# Patient Record
Sex: Female | Born: 1975 | Race: Black or African American | Hispanic: Refuse to answer | Marital: Married | Smoking: Never smoker
Health system: Southern US, Community
[De-identification: ages and names within clinical notes are randomized; demographics above are authoritative.]

## PROBLEM LIST (undated history)

## (undated) ENCOUNTER — Ambulatory Visit (HOSPITAL_COMMUNITY): Disposition: A | Discharge status: Home / Self Care | Payer: 59

## (undated) DIAGNOSIS — T7840XA Allergy, unspecified, initial encounter: Secondary | ICD-10-CM

## (undated) DIAGNOSIS — Z789 Other specified health status: Secondary | ICD-10-CM

## (undated) HISTORY — PX: NO PAST SURGERIES: SHX2092

## (undated) HISTORY — DX: Allergy, unspecified, initial encounter: T78.40XA

---

## 2013-04-18 ENCOUNTER — Ambulatory Visit: Payer: 59 | Admitting: Family Medicine

## 2013-04-18 ENCOUNTER — Ambulatory Visit: Payer: 59

## 2013-04-18 VITALS — BP 100/62 | HR 71 | Temp 98.4°F | Resp 16 | Ht 66.5 in | Wt 180.0 lb

## 2013-04-18 DIAGNOSIS — R109 Unspecified abdominal pain: Secondary | ICD-10-CM

## 2013-04-18 DIAGNOSIS — K5900 Constipation, unspecified: Secondary | ICD-10-CM

## 2013-04-18 DIAGNOSIS — L299 Pruritus, unspecified: Secondary | ICD-10-CM

## 2013-04-18 LAB — POCT CBC
Granulocyte percent: 44.3 %G (ref 37–80)
HCT, POC: 40.2 % (ref 37.7–47.9)
Hemoglobin: 12.2 g/dL (ref 12.2–16.2)
Lymph, poc: 2.5 (ref 0.6–3.4)
MCH, POC: 27.3 pg (ref 27–31.2)
MCHC: 30.3 g/dL — AB (ref 31.8–35.4)
MCV: 89.9 fL (ref 80–97)
MID (cbc): 0.3 (ref 0–0.9)
MPV: 8.1 fL (ref 0–99.8)
POC Granulocyte: 2.3 (ref 2–6.9)
POC LYMPH PERCENT: 49.1 %L (ref 10–50)
POC MID %: 6.6 %M (ref 0–12)
Platelet Count, POC: 297 10*3/uL (ref 142–424)
RBC: 4.47 M/uL (ref 4.04–5.48)
RDW, POC: 14.4 %
WBC: 5.1 10*3/uL (ref 4.6–10.2)

## 2013-04-18 MED ORDER — NEOMYCIN-POLYMYXIN-HC 3.5-10000-1 OT SOLN: OTIC | Status: DC

## 2013-04-18 NOTE — Progress Notes (Signed)
Subjective:  37 year old lady who's been having problems with her left ear itching. She had this last year when she was in [place] and was given some antibiotics and some drops and things cleared up. This bothers her intermittently.   For the last 3 months she's been having abdominal bloating and discomfort. She feels like she has passed gas every 10 minutes. She says she's changed her diet. She is eating a lot more greens. She has tried Gas-X without help. She says her bowels move once every day, twice if she drinks milk.  Objective: Pleasant lady in no major distress. TMs normal. Ear canals look okay. Neck supple without nodes. Throat clear. Chest clear. Heart regular without murmurs. Abdomen has normal bowel sounds. Soft without masses though she feels like she has a moderate amount of stool in colon.  [facility] reading (PRIMARY) by  Dr. Alwyn Ren Normal abdomen. Moderate amount of stool in colon.  Results for orders placed in visit on 04/18/13  POCT CBC      Result Value Range   WBC 5.1  4.6 - 10.2 K/uL   Lymph, poc 2.5  0.6 - 3.4   POC LYMPH PERCENT 49.1  10 - 50 %L   MID (cbc) 0.3  0 - 0.9   POC MID % 6.6  0 - 12 %M   POC Granulocyte 2.3  2 - 6.9   Granulocyte percent 44.3  37 - 80 %G   RBC 4.47  4.04 - 5.48 M/uL   Hemoglobin 12.2  12.2 - 16.2 g/dL   HCT, POC 16.1  09.6 - 47.9 %   MCV 89.9  80 - 97 fL   MCH, POC 27.3  27 - 31.2 pg   MCHC 30.3 (*) 31.8 - 35.4 g/dL   RDW, POC 04.5     Platelet Count, POC 297  142 - 424 K/uL   MPV 8.1  0 - 99.8 fL   Assessment: Itching years, possible mild otitis externa History of allergic rhinitis Abdominal pain and bloating, probably secondary to constipation  Plan: MiraLax Cortisporin Otic drops  Return if not doing better  .

## 2013-04-18 NOTE — Patient Instructions (Addendum)
Drink lots of water  Get regular exercise  Take MiraLax one dose daily until stools are loose, then drop back to one half dose daily, then just use it as needed.  Use 3 or 4 drops of Cortisporin in the ear twice daily as needed for itching and irritation   Constipation, Adult Constipation is when a person has fewer than 3 bowel movements a week; has difficulty having a bowel movement; or has stools that are dry, hard, or larger than normal. As people grow older, constipation is more common. If you try to fix constipation with medicines that make you have a bowel movement (laxatives), the problem may get worse. Long-term laxative use may cause the muscles of the colon to become weak. A low-fiber diet, not taking in enough fluids, and taking certain medicines may make constipation worse. CAUSES   Certain medicines, such as antidepressants, pain medicine, iron supplements, antacids, and water pills.   Certain diseases, such as diabetes, irritable bowel syndrome (IBS), thyroid disease, or depression.   Not drinking enough water.   Not eating enough fiber-rich foods.   Stress or travel.  Lack of physical activity or exercise.  Not going to the restroom when there is the urge to have a bowel movement.  Ignoring the urge to have a bowel movement.  Using laxatives too much. SYMPTOMS   Having fewer than 3 bowel movements a week.   Straining to have a bowel movement.   Having hard, dry, or larger than normal stools.   Feeling full or bloated.   Pain in the lower abdomen.  Not feeling relief after having a bowel movement. DIAGNOSIS  Your caregiver will take a medical history and perform a physical exam. Further testing may be done for severe constipation. Some tests may include:   A barium enema X-ray to examine your rectum, colon, and sometimes, your small intestine.  A sigmoidoscopy to examine your lower colon.  A colonoscopy to examine your entire colon. TREATMENT   Treatment will depend on the severity of your constipation and what is causing it. Some dietary treatments include drinking more fluids and eating more fiber-rich foods. Lifestyle treatments may include regular exercise. If these diet and lifestyle recommendations do not help, your caregiver may recommend taking over-the-counter laxative medicines to help you have bowel movements. Prescription medicines may be prescribed if over-the-counter medicines do not work.  HOME CARE INSTRUCTIONS   Increase dietary fiber in your diet, such as fruits, vegetables, whole grains, and beans. Limit high-fat and processed sugars in your diet, such as [place] fries, hamburgers, cookies, candies, and soda.   A fiber supplement may be added to your diet if you cannot get enough fiber from foods.   Drink enough fluids to keep your urine clear or pale yellow.   Exercise regularly or as directed by your caregiver.   Go to the restroom when you have the urge to go. Do not hold it.  Only take medicines as directed by your caregiver. Do not take other medicines for constipation without talking to your caregiver first. SEEK IMMEDIATE MEDICAL CARE IF:   You have bright red blood in your stool.   Your constipation lasts for more than 4 days or gets worse.   You have abdominal or rectal pain.   You have thin, pencil-like stools.  You have unexplained weight loss. MAKE SURE YOU:   Understand these instructions.  Will watch your condition.  Will get help right away if you are not doing well or  get worse. Document Released: 08/15/2004 Document Revised: 02/09/2012 Document Reviewed: 10/21/2011 [facility] Patient Information 2013 [place], [place].

## 2013-04-19 LAB — COMPREHENSIVE METABOLIC PANEL
ALT: 12 U/L (ref 0–35)
Alkaline Phosphatase: 37 U/L — ABNORMAL LOW (ref 39–117)
Sodium: 140 mEq/L (ref 135–145)
Total Bilirubin: 0.1 mg/dL — ABNORMAL LOW (ref 0.3–1.2)
Total Protein: 7 g/dL (ref 6.0–8.3)

## 2013-07-09 ENCOUNTER — Ambulatory Visit: Payer: 59 | Admitting: Family Medicine

## 2013-07-09 VITALS — BP 88/52 | HR 90 | Temp 97.9°F | Resp 18 | Ht 67.0 in | Wt 178.8 lb

## 2013-07-09 DIAGNOSIS — N912 Amenorrhea, unspecified: Secondary | ICD-10-CM

## 2013-07-09 LAB — HCG, QUANTITATIVE, PREGNANCY: hCG, Beta Chain, Quant, S: 40574.9 m[IU]/mL

## 2013-07-09 NOTE — Progress Notes (Signed)
Is a 37 year old woman from [place] who has had a period since June 26. She's insistent on having a blood test for pregnancy because her urine pregnancy test was positive and she's not sure she can trust it  Assessment: Most likely patient is pregnant  Amenorrhea Serum HCG  Signed, Elvina Sidle, MD

## 2013-07-26 LAB — OB RESULTS CONSOLE GC/CHLAMYDIA
Chlamydia: NEGATIVE
GC PROBE AMP, GENITAL: NEGATIVE

## 2013-08-11 LAB — OB RESULTS CONSOLE RUBELLA ANTIBODY, IGM: RUBELLA: IMMUNE

## 2013-08-11 LAB — OB RESULTS CONSOLE ABO/RH: RH Type: POSITIVE

## 2013-08-11 LAB — OB RESULTS CONSOLE RPR: RPR: NONREACTIVE

## 2013-08-11 LAB — OB RESULTS CONSOLE ANTIBODY SCREEN: ANTIBODY SCREEN: NEGATIVE

## 2013-08-11 LAB — OB RESULTS CONSOLE HIV ANTIBODY (ROUTINE TESTING): HIV: NONREACTIVE

## 2013-08-11 LAB — OB RESULTS CONSOLE HEPATITIS B SURFACE ANTIGEN: Hepatitis B Surface Ag: NEGATIVE

## 2013-12-01 NOTE — L&D Delivery Note (Signed)
Patient was C/C/+3 and pushed for 30 minutes with epidural.    Severe variables seen earlier and with some pushes- VE offered, consented and accepted. VEVD  Female infant, Apgars 8,9, weight 8#15.   VE applied 4 times with 2 popoffs and pt delivered head on own.  Shoulder dystocia relieved with mcroberts and delivery of posterior arm within 45 sec. The patient had one mid line second degree episiotomy and one laceration of R distal vagina, both repaired with 2-0 vicryl R.. Fundus was firm. EBL was expected. Placenta was delivered intact. Vagina was clear.  Baby was vigorous and doing skin to skin with mother.  Armiyah Capron A

## 2014-02-03 LAB — OB RESULTS CONSOLE GBS
STREP GROUP B AG: NEGATIVE
STREP GROUP B AG: NEGATIVE

## 2014-02-22 ENCOUNTER — Inpatient Hospital Stay (HOSPITAL_COMMUNITY)
Admission: AD | Admit: 2014-02-22 | Discharge: 2014-02-22 | Disposition: A | Discharge status: Home / Self Care | Payer: 59 | Source: Ambulatory Visit | Attending: Obstetrics and Gynecology | Admitting: Obstetrics and Gynecology

## 2014-02-22 ENCOUNTER — Encounter (HOSPITAL_COMMUNITY): Payer: Self-pay | Admitting: *Deleted

## 2014-02-22 ENCOUNTER — Encounter (HOSPITAL_COMMUNITY): Payer: Self-pay | Admitting: Obstetrics

## 2014-02-22 ENCOUNTER — Inpatient Hospital Stay (HOSPITAL_COMMUNITY)
Admission: AD | Admit: 2014-02-22 | Discharge: 2014-02-25 | DRG: 775 | Disposition: A | Discharge status: Home / Self Care | Payer: 59 | Source: Ambulatory Visit | Attending: Obstetrics and Gynecology | Admitting: Obstetrics and Gynecology

## 2014-02-22 DIAGNOSIS — O09529 Supervision of elderly multigravida, unspecified trimester: Secondary | ICD-10-CM | POA: Diagnosis present

## 2014-02-22 DIAGNOSIS — O479 False labor, unspecified: Secondary | ICD-10-CM | POA: Insufficient documentation

## 2014-02-22 HISTORY — DX: Other specified health status: Z78.9

## 2014-02-22 LAB — CBC
HCT: 35.4 % — ABNORMAL LOW (ref 36.0–46.0)
Hemoglobin: 11.8 g/dL — ABNORMAL LOW (ref 12.0–15.0)
MCH: 27.4 pg (ref 26.0–34.0)
MCHC: 33.3 g/dL (ref 30.0–36.0)
MCV: 82.1 fL (ref 78.0–100.0)
Platelets: 213 10*3/uL (ref 150–400)
RBC: 4.31 MIL/uL (ref 3.87–5.11)
RDW: 15.5 % (ref 11.5–15.5)
WBC: 9.6 10*3/uL (ref 4.0–10.5)

## 2014-02-22 MED ORDER — OXYTOCIN BOLUS FROM INFUSION: 500.0000 mL | INTRAVENOUS | Status: DC

## 2014-02-22 MED ORDER — OXYTOCIN 40 UNITS IN LACTATED RINGERS INFUSION - SIMPLE MED
62.5000 mL/h | INTRAVENOUS | Status: DC
  Administered 2014-02-23: 62.5 mL/h via INTRAVENOUS

## 2014-02-22 MED ORDER — OXYCODONE-ACETAMINOPHEN 5-325 MG PO TABS: 1.0000 | ORAL_TABLET | ORAL | Status: DC | PRN

## 2014-02-22 MED ORDER — ACETAMINOPHEN 325 MG PO TABS
650.0000 mg | ORAL_TABLET | ORAL | Status: DC | PRN
  Administered 2014-02-23: 650 mg via ORAL
  Filled 2014-02-22: qty 2

## 2014-02-22 MED ORDER — CITRIC ACID-SODIUM CITRATE 334-500 MG/5ML PO SOLN: 30.0000 mL | ORAL | Status: DC | PRN

## 2014-02-22 MED ORDER — FLEET ENEMA 7-19 GM/118ML RE ENEM: 1.0000 | ENEMA | RECTAL | Status: DC | PRN

## 2014-02-22 MED ORDER — LACTATED RINGERS IV SOLN
INTRAVENOUS | Status: DC
  Administered 2014-02-22 – 2014-02-23 (×2): via INTRAVENOUS
  Administered 2014-02-23 (×2): 125 mL/h via INTRAVENOUS

## 2014-02-22 MED ORDER — LIDOCAINE HCL (PF) 1 % IJ SOLN
30.0000 mL | INTRAMUSCULAR | Status: DC | PRN
  Administered 2014-02-23: 30 mL via SUBCUTANEOUS
  Filled 2014-02-22: qty 30

## 2014-02-22 MED ORDER — IBUPROFEN 600 MG PO TABS: 600.0000 mg | ORAL_TABLET | Freq: Four times a day (QID) | ORAL | Status: DC | PRN

## 2014-02-22 MED ORDER — ONDANSETRON HCL 4 MG/2ML IJ SOLN: 4.0000 mg | Freq: Four times a day (QID) | INTRAMUSCULAR | Status: DC | PRN

## 2014-02-22 MED ORDER — BUTORPHANOL TARTRATE 1 MG/ML IJ SOLN
1.0000 mg | Freq: Once | INTRAMUSCULAR | Status: AC | PRN
  Administered 2014-02-22: 1 mg via INTRAVENOUS
  Filled 2014-02-22: qty 1

## 2014-02-22 MED ORDER — LACTATED RINGERS IV SOLN
500.0000 mL | INTRAVENOUS | Status: DC | PRN
  Administered 2014-02-22: 500 mL via INTRAVENOUS

## 2014-02-22 NOTE — MAU Note (Signed)
Pt insisted on RN to call Dr. Claiborne Billingsallahan reference scheduled appt today at 1400.  Pt advised to go to regular appt. Today. Pt did not want to go home and come back.  Dr. Claiborne Billingsallahan called, advised office was closed for lunch and pt will need to come to appt at scheduled time.  Advised pt/family.  Pt given info on early labor and advised to return to MAU if ROM or pain increases with U/C's every 2-3 minutes for 1-2 hours.

## 2014-02-22 NOTE — MAU Note (Signed)
uc's since 0600 this a.m., pt denies LOF or bleeding.

## 2014-02-22 NOTE — MAU Note (Signed)
Contraction started at 6 am got really strong at 12 noon and has not stop. No bleeding or leaking of fliud

## 2014-02-22 NOTE — Discharge Instructions (Signed)
Early Elective Birth Early elective birth refers to making a choice to have a baby before the time the baby is due. The length of a pregnancy is 9 months, or 40 weeks, starting from the beginning of a woman's last menstrual period. Most women naturally go into labor around 40 weeks of gestation. A full-term pregnancy is considered between 37 weeks and 42 weeks of gestation. Currently, early elective births can take place sometime after 39 weeks of gestation. Most health care providers practice within the guidelines of delivering a baby no later than 42 weeks of gestation and no earlier than 39 weeks of gestation. There are exceptions to this time interval, and the risks involved to the mother and baby need to be considered in those cases.  Induction of labor refers to the use of medicines to bring aboutcontractions. Labor is when the cervix starts to widen (dilate). Active labor is when there are contractions and the cervix has dilated to at least 4 cm. Often times, the earlier a mother is in her pregnancy, the longer it takes to get induced. When the cervix is ready (dilated and soft), an induction may take less than a day. However, when a cervix is far away from being ready (long, closed, and firm), it may take days in a hospital for labor to start.  Currently, 39 weeks of gestation is considered the earliest a health care providershould start the induction process. This is because the longer the baby stays inside the uterus, the lower the risks are to both the baby and mother. However, sometimes there are very good reasons for a pregnancy to be induced before 39 weeks of gestation. These exceptions are specific to each individual pregnancy and need to be considered on a case-by-case basis. A good reason to induce one pregnancy may not be good a good reason for another pregnancy.  REASONS FOR ELECTIVE BIRTH It may be safer to induce labor before 39 weeks of gestation if:   A woman is carrying more than 1  baby. Current standards are to deliver twin pregnancies at 38 weeks of gestation.  A woman is having complications, such as:  High blood pressure caused by pregnancy (preeclampsia).  Bleeding.  Infection.  There are conditions affecting the baby's health, such as:  Intrauterine growth restriction (IUGR), where the baby is not growing well.  Having abnormal fetal heart rate patterns on the monitor (nonreassuring tracing).  Having a lack of fluid that surrounds the baby (oligohydramnios).  Having placental issues.  Fluid that surrounds the baby (amniotic fluid) is leaking. There are many other safety reasons that a pregnancy may need to be induced early. REASONS AGAINST ELECTIVE BIRTH Sometimes early elective birth is not the best choice. It may not be a good idea if:   An early birth is just more convenient.  You want the baby to be born on a certain date, like a holiday.  You are more likely to need a cesarean delivery before 39 weeks of gestation. A cesarean delivery can lead to other problems. Problems include infection, bleeding, and not having enough iron in your blood (anemia), which can cause weakness.  Babies born early (34 37 weeks of gestation):  May need special care at the hospital or in a special care nursery.  Are at a greater risk for:  Brain damage.  Feeding problems.  Breathing problems.  Slow physical and mental development.  May need special care in a neonatal intensive care unit (NICU), but this is rare.   The length of the baby's stay in the hospital will depend on how quickly he or she progresses to a safe level of care.  Are at a greater risk for:  Infection.  Bleeding inside the brain.  Dying during their first year of life. REDUCING EARLY ELECTIVE BIRTHS Carrying a baby longer than 42 weeks of gestation is not good for the baby or the mother. A full-term pregnancy is best for baby and mother. Anything earlier can be risky for you and your  baby. Remember:  An early elective birth may lead to a cesarean delivery. This can lead to other problems for the mother and baby.  An early elective birth can result in developmental problems for your child.  A baby's brain continues to develop while in the uterus.  A baby's body continues to develop. The baby will be better able to breathe and eat when he or she is born near the due date.  A baby who stays in the uterus longer responds better. The baby will also bond better with you. Document Released: 07/30/2011 Document Revised: 09/07/2013 Document Reviewed: 06/16/2013 [employer] Patient Information 2014 [employer].  

## 2014-02-23 ENCOUNTER — Encounter (HOSPITAL_COMMUNITY): Payer: Self-pay | Admitting: *Deleted

## 2014-02-23 ENCOUNTER — Encounter (HOSPITAL_COMMUNITY): Payer: 59 | Admitting: Anesthesiology

## 2014-02-23 ENCOUNTER — Inpatient Hospital Stay (HOSPITAL_COMMUNITY): Payer: 59 | Admitting: Anesthesiology

## 2014-02-23 LAB — TYPE AND SCREEN
ABO/RH(D): O POS
ANTIBODY SCREEN: NEGATIVE

## 2014-02-23 LAB — ABO/RH: ABO/RH(D): O POS

## 2014-02-23 LAB — RPR: RPR: NONREACTIVE

## 2014-02-23 MED ORDER — SODIUM CHLORIDE 0.9 % IJ SOLN: 3.0000 mL | INTRAMUSCULAR | Status: DC | PRN

## 2014-02-23 MED ORDER — SODIUM CHLORIDE 0.9 % IV SOLN: 250.0000 mL | INTRAVENOUS | Status: DC | PRN

## 2014-02-23 MED ORDER — SENNOSIDES-DOCUSATE SODIUM 8.6-50 MG PO TABS
2.0000 | ORAL_TABLET | ORAL | Status: DC
  Administered 2014-02-23: 2 via ORAL
  Administered 2014-02-25: 1 via ORAL
  Filled 2014-02-23 (×2): qty 2

## 2014-02-23 MED ORDER — LANOLIN HYDROUS EX OINT: TOPICAL_OINTMENT | CUTANEOUS | Status: DC | PRN

## 2014-02-23 MED ORDER — LACTATED RINGERS IV SOLN: 500.0000 mL | Freq: Once | INTRAVENOUS | Status: DC

## 2014-02-23 MED ORDER — IBUPROFEN 800 MG PO TABS
800.0000 mg | ORAL_TABLET | Freq: Three times a day (TID) | ORAL | Status: DC
  Administered 2014-02-23 – 2014-02-25 (×4): 800 mg via ORAL
  Filled 2014-02-23 (×4): qty 1

## 2014-02-23 MED ORDER — FENTANYL 2.5 MCG/ML BUPIVACAINE 1/10 % EPIDURAL INFUSION (WH - ANES)
INTRAMUSCULAR | Status: DC | PRN
  Administered 2014-02-23: 14 mL/h via EPIDURAL

## 2014-02-23 MED ORDER — METHYLERGONOVINE MALEATE 0.2 MG/ML IJ SOLN: 0.2000 mg | INTRAMUSCULAR | Status: DC | PRN

## 2014-02-23 MED ORDER — TERBUTALINE SULFATE 1 MG/ML IJ SOLN: 0.2500 mg | Freq: Once | INTRAMUSCULAR | Status: DC | PRN

## 2014-02-23 MED ORDER — INFLUENZA VAC SPLIT QUAD 0.5 ML IM SUSP
0.5000 mL | INTRAMUSCULAR | Status: AC
  Administered 2014-02-24: 0.5 mL via INTRAMUSCULAR

## 2014-02-23 MED ORDER — SIMETHICONE 80 MG PO CHEW: 80.0000 mg | CHEWABLE_TABLET | ORAL | Status: DC | PRN

## 2014-02-23 MED ORDER — EPHEDRINE 5 MG/ML INJ
10.0000 mg | INTRAVENOUS | Status: DC | PRN
  Filled 2014-02-23: qty 2

## 2014-02-23 MED ORDER — FERROUS SULFATE 325 (65 FE) MG PO TABS
325.0000 mg | ORAL_TABLET | Freq: Two times a day (BID) | ORAL | Status: DC
  Administered 2014-02-24 – 2014-02-25 (×3): 325 mg via ORAL
  Filled 2014-02-23 (×3): qty 1

## 2014-02-23 MED ORDER — PRENATAL MULTIVITAMIN CH
1.0000 | ORAL_TABLET | Freq: Every day | ORAL | Status: DC
  Administered 2014-02-24: 1 via ORAL
  Filled 2014-02-23: qty 1

## 2014-02-23 MED ORDER — PHENYLEPHRINE 40 MCG/ML (10ML) SYRINGE FOR IV PUSH (FOR BLOOD PRESSURE SUPPORT)
80.0000 ug | PREFILLED_SYRINGE | INTRAVENOUS | Status: DC | PRN
  Filled 2014-02-23: qty 2

## 2014-02-23 MED ORDER — TETANUS-DIPHTH-ACELL PERTUSSIS 5-2.5-18.5 LF-MCG/0.5 IM SUSP
0.5000 mL | Freq: Once | INTRAMUSCULAR | Status: AC
Start: 2014-02-24 — End: 2014-02-24
  Administered 2014-02-24: 0.5 mL via INTRAMUSCULAR

## 2014-02-23 MED ORDER — ZOLPIDEM TARTRATE 5 MG PO TABS: 5.0000 mg | ORAL_TABLET | Freq: Every evening | ORAL | Status: DC | PRN

## 2014-02-23 MED ORDER — EPHEDRINE 5 MG/ML INJ: 10.0000 mg | INTRAVENOUS | Status: DC | PRN

## 2014-02-23 MED ORDER — ONDANSETRON HCL 4 MG/2ML IJ SOLN
4.0000 mg | INTRAMUSCULAR | Status: DC | PRN
Start: 2014-02-23 — End: 2014-02-25

## 2014-02-23 MED ORDER — LIDOCAINE HCL (PF) 1 % IJ SOLN
INTRAMUSCULAR | Status: DC | PRN
  Administered 2014-02-23: 5 mL
  Administered 2014-02-23: 3 mL
  Administered 2014-02-23: 5 mL

## 2014-02-23 MED ORDER — PHENYLEPHRINE 40 MCG/ML (10ML) SYRINGE FOR IV PUSH (FOR BLOOD PRESSURE SUPPORT): 80.0000 ug | PREFILLED_SYRINGE | INTRAVENOUS | Status: DC | PRN

## 2014-02-23 MED ORDER — DIPHENHYDRAMINE HCL 25 MG PO CAPS: 25.0000 mg | ORAL_CAPSULE | Freq: Four times a day (QID) | ORAL | Status: DC | PRN

## 2014-02-23 MED ORDER — DIBUCAINE 1 % RE OINT: 1.0000 "application " | TOPICAL_OINTMENT | RECTAL | Status: DC | PRN

## 2014-02-23 MED ORDER — WITCH HAZEL-GLYCERIN EX PADS: 1.0000 "application " | MEDICATED_PAD | CUTANEOUS | Status: DC | PRN

## 2014-02-23 MED ORDER — BENZOCAINE-MENTHOL 20-0.5 % EX AERO
1.0000 "application " | INHALATION_SPRAY | CUTANEOUS | Status: DC | PRN
  Administered 2014-02-23 – 2014-02-25 (×3): 1 via TOPICAL
  Filled 2014-02-23 (×3): qty 56

## 2014-02-23 MED ORDER — MEASLES, MUMPS & RUBELLA VAC ~~LOC~~ INJ
0.5000 mL | INJECTION | Freq: Once | SUBCUTANEOUS | Status: DC
Start: 2014-02-24 — End: 2014-02-24

## 2014-02-23 MED ORDER — FENTANYL 2.5 MCG/ML BUPIVACAINE 1/10 % EPIDURAL INFUSION (WH - ANES)
14.0000 mL/h | INTRAMUSCULAR | Status: DC | PRN
  Administered 2014-02-23: 14 mL/h via EPIDURAL
  Filled 2014-02-23 (×3): qty 125

## 2014-02-23 MED ORDER — SODIUM CHLORIDE 0.9 % IJ SOLN: 3.0000 mL | Freq: Two times a day (BID) | INTRAMUSCULAR | Status: DC

## 2014-02-23 MED ORDER — FENTANYL 2.5 MCG/ML BUPIVACAINE 1/10 % EPIDURAL INFUSION (WH - ANES): 14.0000 mL/h | INTRAMUSCULAR | Status: DC | PRN

## 2014-02-23 MED ORDER — OXYTOCIN 40 UNITS IN LACTATED RINGERS INFUSION - SIMPLE MED
1.0000 m[IU]/min | INTRAVENOUS | Status: DC
  Administered 2014-02-23: 2 m[IU]/min via INTRAVENOUS
  Filled 2014-02-23: qty 1000

## 2014-02-23 MED ORDER — ONDANSETRON HCL 4 MG PO TABS: 4.0000 mg | ORAL_TABLET | ORAL | Status: DC | PRN

## 2014-02-23 MED ORDER — FAMOTIDINE 20 MG PO TABS
20.0000 mg | ORAL_TABLET | Freq: Two times a day (BID) | ORAL | Status: DC
  Administered 2014-02-23 – 2014-02-25 (×3): 20 mg via ORAL
  Filled 2014-02-23 (×4): qty 1

## 2014-02-23 MED ORDER — OXYCODONE-ACETAMINOPHEN 5-325 MG PO TABS
1.0000 | ORAL_TABLET | ORAL | Status: DC | PRN
  Administered 2014-02-23: 1 via ORAL
  Filled 2014-02-23: qty 1

## 2014-02-23 MED ORDER — DIPHENHYDRAMINE HCL 50 MG/ML IJ SOLN: 12.5000 mg | INTRAMUSCULAR | Status: DC | PRN

## 2014-02-23 MED ORDER — LORATADINE 10 MG PO TABS
10.0000 mg | ORAL_TABLET | Freq: Every day | ORAL | Status: DC
  Administered 2014-02-24 – 2014-02-25 (×2): 10 mg via ORAL
  Filled 2014-02-23 (×3): qty 1

## 2014-02-23 MED ORDER — EPHEDRINE 5 MG/ML INJ
10.0000 mg | INTRAVENOUS | Status: DC | PRN
  Filled 2014-02-23: qty 4
  Filled 2014-02-23: qty 2

## 2014-02-23 MED ORDER — PHENYLEPHRINE 40 MCG/ML (10ML) SYRINGE FOR IV PUSH (FOR BLOOD PRESSURE SUPPORT)
80.0000 ug | PREFILLED_SYRINGE | INTRAVENOUS | Status: DC | PRN
  Filled 2014-02-23: qty 10
  Filled 2014-02-23: qty 2

## 2014-02-23 MED ORDER — METHYLERGONOVINE MALEATE 0.2 MG PO TABS: 0.2000 mg | ORAL_TABLET | ORAL | Status: DC | PRN

## 2014-02-23 MED ORDER — MAGNESIUM HYDROXIDE 400 MG/5ML PO SUSP: 30.0000 mL | ORAL | Status: DC | PRN

## 2014-02-23 NOTE — Anesthesia Preprocedure Evaluation (Signed)

## 2014-02-23 NOTE — Anesthesia Procedure Notes (Signed)
Epidural Patient location during procedure: OB  Staffing Anesthesiologist: Arlene Brickel Performed by: anesthesiologist   Preanesthetic Checklist Completed: patient identified, site marked, surgical consent, pre-op evaluation, timeout performed, IV checked, risks and benefits discussed and monitors and equipment checked  Epidural Patient position: sitting Prep: ChloraPrep Patient monitoring: heart rate, continuous pulse ox and blood pressure Approach: right paramedian Location: L2-L3 Injection technique: LOR saline  Needle:  Needle type: Tuohy  Needle gauge: 17 G Needle length: 9 cm and 9 Needle insertion depth: 6 cm Catheter type: closed end flexible Catheter size: 20 Guage Catheter at skin depth: 11 cm Test dose: negative  Assessment Events: blood not aspirated, injection not painful, no injection resistance, negative IV test and no paresthesia  Additional Notes   Patient tolerated the insertion well without complications.   

## 2014-02-23 NOTE — Progress Notes (Deleted)
While in other room for delivery, I watched this strip.  In the last 30 minutes she has had lates that have develolped into severe variables in late presentation.  Short term variability is good but accels are not reactive.  SVE 4-5/80/-2  Pt is still hours away from vaginal delivery and strip is not reassuring.  These episodes of moderate to severe variables have persisted on and off for several hours and in last 30 min have been persistent.  There is meconium stained fluid indicating that baby is stressed.   All R/B/Alt of LTCS d/w pt and she agrees to proceed with urgent case.

## 2014-02-23 NOTE — Progress Notes (Signed)
CTSP  Pt now C/C/  But now having repetitive moderate variables.  Still has great short term variability but now tachy.

## 2014-02-23 NOTE — Lactation Note (Signed)
This note was copied from the chart of Megan [name]. Lactation Consultation Note Mom called for assistance in pumping. RN had set up DEBP.mom stated baby will not eat. Baby born at [id number] today. Laying on mom's chest STS. Mom insist on pumping right away for baby to eat. Very worried about baby not eating, and baby was occasionally crying when touched. Explained this was all normal, baby's usually don't eat much the first 24 hrs. I did suck training w/gloved finger. Biting, wouldn't open mouth much, tongue wouldn't come past gum line. DEBP applied to Lt. Nipple, mom concerned nothing coming out. Demonstrated hand expression, explained could get more colostrum that way d/t thickness. Mom happy to see colostrum w/hand expression. Mom wanted to know who was going to change the babies diapers. I explained parents are responsible for changing the babies diaper d/t they will need to know how when they go home. Mom appeared stressed and tired. Stated she needed to go to the BR. Nurse called. I tried to explain the dynamics of DEBP but mom didn't appear to be an attentive mode. Will return later this evening to check on pt. And try to finish teaching. Spoke good English, FOB at bedside spoke good [place] as well. Patient Name: Megan Kalman JewelsYasmin Al [name] EAVWU'JToday's Date: 02/23/2014 Reason for consult: Initial assessment   Maternal Data Infant to breast within first hour of birth: Yes (wouldn't feed) Has patient been taught Hand Expression?: Yes (but will need reinstructed) Does the patient have breastfeeding experience prior to this delivery?: No  Feeding Feeding Type: Breast Fed Length of feed: 0 min (rooting but wouldn't suck)  LATCH Score/Interventions Latch: Too sleepy or reluctant, no latch achieved, no sucking elicited. Intervention(s): Skin to skin;Teach feeding cues  Audible Swallowing: None  Type of Nipple: Flat (short shaft/compresses inward ) Intervention(s): Shells;Double electric  pump  Comfort (Breast/Nipple): Soft / non-tender     Hold (Positioning): Full assist, staff holds infant at breast  LATCH Score: 3  Lactation Tools Discussed/Used Tools: Shells;Pump Shell Type: Inverted Breast pump type: Double-Electric Breast Pump Pump Review: Other (comment) (needs reviewing, mom to uptight to comprehend.) Initiated by:: Peri JeffersonL. Cache Bills RN Date initiated:: 02/23/14   Consult Status Date: 02/23/14 Follow-up type: In-patient    Charyl DancerCARVER, Reily Ilic G 02/23/2014, 8:52 PM

## 2014-02-23 NOTE — Lactation Note (Signed)
This note was copied from the chart of Megan Kalyse Al [name]. Lactation Consultation Note Mom upset, standing and pumping to stimulate breast. Grandmother crying, mom says she is crying because the baby is hungry when he cries and their culture they feed the baby "herbs" for 2 days until the milk comes in. Asking please give the baby something. Discussed the risk of formula and bottle feeding, agreed to give formula in cup or syring. States she will pump every 3 hrs. To stimulate breast and if she gets any colostrum she will give that to the baby. Baby has no interest in BF at this time. Fretting, rooting, but will not latch. Formula guidelines given according to age w/measuring cup and foley cup. Mom very appreciative and thanking me. Mom w/teach back instructions on feeding baby done. Mom DEBP for 10 min. And RN gave baby .5ml colostrum in syring given before cup feeding. Breast massage and hand expression demonstrated again. Encouraged to call for assistance if needed and to verify proper latch. Patient Name: Megan [name] ZOXWR'UToday's Date: 02/23/2014 Reason for consult: Initial assessment   Maternal Data Infant to breast within first hour of birth: Yes (wouldn't feed) Has patient been taught Hand Expression?: Yes (but will need reinstructed) Does the patient have breastfeeding experience prior to this delivery?: No  Feeding Feeding Type: Breast Milk Length of feed: 0 min (rooting but wouldn't suck)  LATCH Score/Interventions Latch: Too sleepy or reluctant, no latch achieved, no sucking elicited. Intervention(s): Skin to skin;Teach feeding cues;Waking techniques  Audible Swallowing: None Intervention(s): Hand expression (Mom states that it hurts her and doesnt want to do it)  Type of Nipple: Flat Intervention(s): Double electric pump  Comfort (Breast/Nipple): Soft / non-tender     Hold (Positioning): Full assist, staff holds infant at breast Intervention(s): Support  Pillows;Position options  LATCH Score: 3  Lactation Tools Discussed/Used Tools: Shells;Pump Shell Type: Inverted Breast pump type: Double-Electric Breast Pump Pump Review: Other (comment) (needs reviewing, mom to uptight to comprehend.) Initiated by:: Peri JeffersonL. Milledge Gerding RN Date initiated:: 02/23/14   Consult Status Date: 02/23/14 Follow-up type: In-patient    Charyl DancerCARVER, Constantinos Krempasky G 02/23/2014, 11:04 PM

## 2014-02-23 NOTE — H&P (Signed)
38 y.o. 6258w1d  G2P0010 comes in c/o labor.  Otherwise has good fetal movement and no bleeding.  Past Medical History  Diagnosis Date  . Allergy   . Medical history non-contributory     Past Surgical History  Procedure Laterality Date  . No past surgeries      OB History  Gravida Para Term Preterm AB SAB TAB Ectopic Multiple Living  2    1 1     0    # Outcome Date GA Lbr Len/2nd Weight Sex Delivery Anes PTL Lv  2 CUR           1 SAB               History   Social History  . Marital Status: Married    Spouse Name: N/[name]    Number of Children: N/[name]  . Years of Education: N/[name]   Occupational History  . Not on file.   Social History Main Topics  . Smoking status: Never Smoker   . Smokeless tobacco: Not on file  . Alcohol Use: No  . Drug Use: No  . Sexual Activity: Yes    Birth Control/ Protection: None   Other Topics Concern  . Not on file   Social History Narrative  . No narrative on file   Banana    Prenatal Transfer Tool  Maternal Diabetes: No Genetic Screening: Normal - amnio 46 XY Maternal Ultrasounds/Referrals: Normal Fetal Ultrasounds or other Referrals:  None Maternal Substance Abuse:  No Significant Maternal Medications:  None Significant Maternal Lab Results: None  Other PNC: uncomplicated.    Filed Vitals:   02/23/14 0722  BP: 96/60  Pulse: 91  Temp: 99.2 F (37.3 C)  Resp: 18     Lungs/Cor:  NAD Abdomen:  soft, gravid Ex:  no cords, erythema SVE:  2.5/C/-1 per nurse FHTs:  140, good STV, NST R Toco:  q3   [name]/P   Early labor.  GBS neg.  Megan [name]

## 2014-02-24 LAB — CBC
HEMATOCRIT: 29.7 % — AB (ref 36.0–46.0)
HEMOGLOBIN: 9.7 g/dL — AB (ref 12.0–15.0)
MCH: 27 pg (ref 26.0–34.0)
MCHC: 32.7 g/dL (ref 30.0–36.0)
MCV: 82.7 fL (ref 78.0–100.0)
Platelets: 171 10*3/uL (ref 150–400)
RBC: 3.59 MIL/uL — ABNORMAL LOW (ref 3.87–5.11)
RDW: 15.6 % — ABNORMAL HIGH (ref 11.5–15.5)
WBC: 14.7 10*3/uL — ABNORMAL HIGH (ref 4.0–10.5)

## 2014-02-24 NOTE — Progress Notes (Signed)
Post Partum Day 1 Subjective: up ad lib, voiding, tolerating PO and + flatus Patient tearful because she is having difficulty breast feeding.  Objective: Blood pressure 114/63, pulse 81, temperature 97.8 F (36.6 C), temperature source Oral, resp. rate 17, height 5\' 7"  (1.702 m), weight 104.327 kg (230 lb), last menstrual period 05/23/2013, SpO2 100.00%, unknown if currently breastfeeding.  Physical Exam:  General: alert, cooperative and no distress Lochia: appropriate Uterine Fundus: firm Incision (perineum): healing well DVT Evaluation: No evidence of DVT seen on physical exam. Negative Homan's sign. No significant calf/ankle edema.   Recent Labs  02/22/14 2304 02/24/14 0639  HGB 11.8* 9.7*  HCT 35.4* 29.7*    Assessment/Plan: Plan for discharge tomorrow, Lactation consult and Circumcision prior to discharge   LOS: 2 days   Megan [name] STACIA 02/24/2014, 8:49 AM

## 2014-02-24 NOTE — Lactation Note (Signed)
This note was copied from the chart of Megan [name]. Lactation Consultation Note Follow up visit at 27 hours of age.  Mom is very concerned about how she is going to feed her baby at home.  She is supplementing with formula and pumping every 3 hours and not seeing any colostrum.  Explained to mom this is normal and tried to calm her, mom is very anxious.  Mom reports trying to breast feed all day, but nothing is documented and I question moms breast attempts.  Mom says baby is hungry, but doesn't want to breast feed baby now.  Explained to mom supply and demand and need for baby to be at breast, but discussed other feeding options.  Mom attempts latch in football hold with poor positioning and does not follow instructions for improvement on latch.  Baby sucks at the tip of the nipple when mom hand expressed colostrum. Discussed how this was not a latch and baby doesn't work to get breast milk that way. I attempted to hand express prior to latch and mom was too uncomfortable with the pain.  Mom made loud moans sounding like pain many times during baby's attempt to latch but says its not pain just the feeling because this is new to her.  Encouraged mom to continue STS and latch attempt and to then supplement 7-12 mls per guidelines for age.  Mom seems very unsure about her commitment to breast feeding.  Encouraged mom to continue to pump every 3 hours and to hand express.  Mom is guarded with accepting help from me [facility].  Might even questions moms competence in learning to breast feed, but it may be cultural.   [facility] and discussed future feeding options if baby does not latch well.  Patient Name: Megan [name] ZOXWR'UToday's Date: 02/24/2014 Reason for consult: Initial assessment;Follow-up assessment;Difficult latch   Maternal Data Has patient been taught Hand Expression?: Yes  Feeding Feeding Type: Breast Fed  LATCH Score/Interventions Latch: Repeated attempts needed  to sustain latch, nipple held in mouth throughout feeding, stimulation needed to elicit sucking reflex. Intervention(s): Skin to skin;Teach feeding cues;Waking techniques Intervention(s): Adjust position;Assist with latch;Breast massage;Breast compression  Audible Swallowing: None Intervention(s): Hand expression;Skin to skin  Type of Nipple: Flat  Comfort (Breast/Nipple): Soft / non-tender     Hold (Positioning): Assistance needed to correctly position infant at breast and maintain latch. Intervention(s): Breastfeeding basics reviewed;Support Pillows;Position options;Skin to skin  LATCH Score: 5  Lactation Tools Discussed/Used Breast pump type: Double-Electric Breast Pump   Consult Status Consult Status: Follow-up Date: 02/25/14 Follow-up type: In-patient    Beverely RisenShoptaw, Arvella MerlesJana Lynn 02/24/2014, 8:18 PM

## 2014-02-24 NOTE — Anesthesia Postprocedure Evaluation (Signed)
  Anesthesia Post-op Note  Patient: Megan [name]  Procedure(s) Performed: * No procedures listed *  Patient Location: Mother/Baby  Anesthesia Type:Epidural  Level of Consciousness: awake and alert   Airway and Oxygen Therapy: Patient Spontanous Breathing  Post-op Pain: mild  Post-op Assessment: Patient's Cardiovascular Status Stable, Respiratory Function Stable, No signs of Nausea or vomiting, Pain level controlled, No headache, No residual numbness and No residual motor weakness  Post-op Vital Signs: stable  Complications: No apparent anesthesia complications

## 2014-02-25 MED ORDER — IBUPROFEN 800 MG PO TABS: 800.0000 mg | ORAL_TABLET | Freq: Three times a day (TID) | ORAL | Status: AC

## 2014-02-25 NOTE — Progress Notes (Signed)
Post Partum Day 2 Subjective: no complaints, up ad lib, voiding, tolerating PO and + flatus  Objective: Blood pressure 126/82, pulse 76, temperature 97.9 F (36.6 C), temperature source Oral, resp. rate 18, height 5\' 7"  (1.702 m), weight 104.327 kg (230 lb), last menstrual period 05/23/2013, SpO2 100.00%, unknown if currently breastfeeding.  Physical Exam:  General: alert, cooperative and no distress Lochia: appropriate Uterine Fundus: firm DVT Evaluation: No evidence of DVT seen on physical exam. No significant calf/ankle edema.   Recent Labs  02/22/14 2304 02/24/14 0639  HGB 11.8* 9.7*  HCT 35.4* 29.7*    Assessment/Plan: Discharge home and Contraception will discuss at post partum visit   LOS: 3 days   Megan [name] STACIA 02/25/2014, 8:19 AM

## 2014-02-25 NOTE — Discharge Instructions (Signed)
°Iron-Rich Diet ° °An iron-rich diet contains foods that are good sources of iron. Iron is an important mineral that helps your body produce hemoglobin. Hemoglobin is a protein in red blood cells that carries oxygen to the body's tissues. Sometimes, the iron level in your blood can be low. This may be caused by: °· A lack of iron in your diet. °· Blood loss. °· Times of growth, such as during pregnancy or during a child's growth and development. °Low levels of iron can cause a decrease in the number of red blood cells. This can result in iron deficiency anemia. Iron deficiency anemia symptoms include: °· Tiredness. °· Weakness. °· Irritability. °· Increased chance of infection. °Here are some recommendations for daily iron intake: °· Males older than 37 years of age need 8 mg of iron per day. °· Women ages 19 to 50 need 18 mg of iron per day. °· Pregnant women need 27 mg of iron per day, and women who are over 19 years of age and breastfeeding need 9 mg of iron per day. °· Women over the age of 50 need 8 mg of iron per day. °SOURCES OF IRON °There are 2 types of iron that are found in food: heme iron and nonheme iron. Heme iron is absorbed by the body better than nonheme iron. Heme iron is found in meat, poultry, and fish. Nonheme iron is found in grains, beans, and vegetables. °Heme Iron Sources °Food / Iron (mg) °· Chicken liver, 3 oz (85 g)/ 10 mg °· Beef liver, 3 oz (85 g)/ 5.5 mg °· Oysters, 3 oz (85 g)/ 8 mg °· Beef, 3 oz (85 g)/ 2 to 3 mg °· Shrimp, 3 oz (85 g)/ 2.8 mg °· Turkey, 3 oz (85 g)/ 2 mg °· Chicken, 3 oz (85 g) / 1 mg °· Fish (tuna, halibut), 3 oz (85 g)/ 1 mg °· Pork, 3 oz (85 g)/ 0.9 mg °Nonheme Iron Sources °Food / Iron (mg) °· Ready-to-eat breakfast cereal, iron-fortified / 3.9 to 7 mg °· Tofu, ½ cup / 3.4 mg °· Kidney beans, ½ cup / 2.6 mg °· Baked potato with skin / 2.7 mg °· Asparagus, ½ cup / 2.2 mg °· Avocado / 2 mg °· Dried peaches, ½ cup / 1.6 mg °· Raisins, ½ cup / 1.5 mg °· Soy milk, 1  cup / 1.5 mg °· Whole-wheat bread, 1 slice / 1.2 mg °· Spinach, 1 cup / 0.8 mg °· Broccoli, ½ cup / 0.6 mg °IRON ABSORPTION °Certain foods can decrease the body's absorption of iron. Try to avoid these foods and beverages while eating meals with iron-containing foods: °· Coffee. °· Tea. °· Fiber. °· Soy. °Foods containing vitamin C can help increase the amount of iron your body absorbs from iron sources, especially from nonheme sources. Eat foods with vitamin C along with iron-containing foods to increase your iron absorption. Foods that are high in vitamin C include many fruits and vegetables. Some good sources are: °· Fresh orange juice. °· Oranges. °· Strawberries. °· Mangoes. °· Grapefruit. °· Red bell peppers. °· Green bell peppers. °· Broccoli. °· Potatoes with skin. °· Tomato juice. °Document Released: 07/01/2005 Document Revised: 02/09/2012 Document Reviewed: 05/08/2011 °[employer]® Patient Information ©2014 [employer]. °Vaginal Delivery °Care After °Refer to this sheet in the next few weeks. These discharge instructions provide you with information on caring for yourself after delivery. Your caregiver may also give you specific instructions. Your treatment has been planned according to the most current   medical practices available, but problems sometimes occur. Call your caregiver if you have any problems or questions after you go home. °HOME CARE INSTRUCTIONS °· Take over-the-counter or prescription medicines only as directed by your caregiver or pharmacist. °· Do not drink alcohol, especially if you are breastfeeding or taking medicine to relieve pain. °· Do not chew or smoke tobacco. °· Do not use illegal drugs. °· Continue to use good perineal care. Good perineal care includes: °· Wiping your perineum from front to back. °· Keeping your perineum clean. °· Do not use tampons or douche until your caregiver says it is okay. °· Shower, wash your hair, and take tub baths as directed by your  caregiver. °· Wear a well-fitting bra that provides breast support. °· Eat healthy foods. °· Drink enough fluids to keep your urine clear or pale yellow. °· Eat high-fiber foods such as whole grain cereals and breads, brown rice, beans, and fresh fruits and vegetables every day. These foods may help prevent or relieve constipation. °· Follow your cargiver's recommendations regarding resumption of activities such as climbing stairs, driving, lifting, exercising, or traveling. °· Talk to your caregiver about resuming sexual activities. Resumption of sexual activities is dependent upon your risk of infection, your rate of healing, and your comfort and desire to resume sexual activity. °· Try to have someone help you with your household activities and your newborn for at least a few days after you leave the hospital. °· Rest as much as possible. Try to rest or take a nap when your newborn is sleeping. °· Increase your activities gradually. °· Keep all of your scheduled postpartum appointments. It is very important to keep your scheduled follow-up appointments. At these appointments, your caregiver will be checking to make sure that you are healing physically and emotionally. °SEEK MEDICAL CARE IF:  °· You are passing large clots from your vagina. Save any clots to show your caregiver. °· You have a foul smelling discharge from your vagina. °· You have trouble urinating. °· You are urinating frequently. °· You have pain when you urinate. °· You have a change in your bowel movements. °· You have increasing redness, pain, or swelling near your vaginal incision (episiotomy) or vaginal tear. °· You have pus draining from your episiotomy or vaginal tear. °· Your episiotomy or vaginal tear is separating. °· You have painful, hard, or reddened breasts. °· You have a severe headache. °· You have blurred vision or see spots. °· You feel sad or depressed. °· You have thoughts of hurting yourself or your newborn. °· You have  questions about your care, the care of your newborn, or medicines. °· You are dizzy or lightheaded. °· You have a rash. °· You have nausea or vomiting. °· You were breastfeeding and have not had a menstrual period within 12 weeks after you stopped breastfeeding. °· You are not breastfeeding and have not had a menstrual period by the 12th week after delivery. °· You have a fever. °SEEK IMMEDIATE MEDICAL CARE IF:  °· You have persistent pain. °· You have chest pain. °· You have shortness of breath. °· You faint. °· You have leg pain. °· You have stomach pain. °· Your vaginal bleeding saturates two or more sanitary pads in 1 hour. °MAKE SURE YOU:  °· Understand these instructions. °· Will watch your condition. °· Will get help right away if you are not doing well or get worse. °Document Released: 11/14/2000 Document Revised: 08/11/2012 Document Reviewed: 07/14/2012 °[employer]® Patient Information ©  2014 [employer]. ° °

## 2014-02-25 NOTE — Discharge Summary (Signed)
Obstetric Discharge Summary Reason for Admission: onset of labor Prenatal Procedures: none Intrapartum Procedures: spontaneous vaginal delivery Postpartum Procedures: none Complications-Operative and Postpartum: none Hemoglobin  Date Value Ref Range Status  02/24/2014 9.7* 12.0 - 15.0 g/dL Final     DELTA CHECK NOTED     REPEATED TO VERIFY  04/18/2013 12.2  12.2 - 16.2 g/dL Final     HCT  Date Value Ref Range Status  02/24/2014 29.7* 36.0 - 46.0 % Final     HCT, POC  Date Value Ref Range Status  04/18/2013 40.2  37.7 - 47.9 % Final    Physical Exam:  General: alert, cooperative and no distress Lochia: appropriate Uterine Fundus: firm DVT Evaluation: No evidence of DVT seen on physical exam. No significant calf/ankle edema.  Discharge Diagnoses: Term Pregnancy-delivered  Discharge Information: Date: 02/25/2014 Activity: pelvic rest Diet: routine Medications: PNV and Ibuprofen Condition: stable Instructions: refer to practice specific booklet Discharge to: home   Newborn Data: Live born female  Birth Weight: 8 lb 15 oz (4054 g) APGAR: 8, 9  Home with mother.  Megan HartINN, Megan [name] STACIA 02/25/2014, 8:26 AM

## 2014-07-19 ENCOUNTER — Ambulatory Visit (INDEPENDENT_AMBULATORY_CARE_PROVIDER_SITE_OTHER): Payer: 59 | Admitting: Podiatry

## 2014-07-19 ENCOUNTER — Encounter: Payer: Self-pay | Admitting: Podiatry

## 2014-07-19 VITALS — BP 102/68 | HR 68 | Ht 67.0 in | Wt 205.0 lb

## 2014-07-19 DIAGNOSIS — M79676 Pain in unspecified toe(s): Secondary | ICD-10-CM | POA: Insufficient documentation

## 2014-07-19 DIAGNOSIS — M79609 Pain in unspecified limb: Secondary | ICD-10-CM

## 2014-07-19 DIAGNOSIS — L600 Ingrowing nail: Secondary | ICD-10-CM | POA: Insufficient documentation

## 2014-07-19 MED ORDER — EFINACONAZOLE 10 % EX SOLN: 1.0000 | Freq: Every morning | CUTANEOUS | Status: AC

## 2014-07-19 NOTE — Progress Notes (Signed)
38 year old female presents with 966 mo old baby with ingrown nail x 1 month. Has had previously ingrown nail and had surgery done.  Stated that she has to go to work [employer] and [employer] execut[employer]orrow.  Objective: Dermatologic: Ingrown nail with erythema at medial border of both great toes. Mild fungal nail left great toe. Neurovascular status are within normal. Normal osseous structures.  Assessment: Ingrown nail with inflammation both great toes at medial border. No active drainage noted. Fungal nail left hallux.  Plan: Reviewed findings and available options. Patient will return on her day off to have nail surgery done.

## 2014-07-19 NOTE — Patient Instructions (Signed)
Seen for ingrown nail. Patient will return for ingrown nail surgery on her day off.

## 2014-08-04 ENCOUNTER — Encounter: Payer: Self-pay | Admitting: Podiatry

## 2014-08-04 ENCOUNTER — Ambulatory Visit (INDEPENDENT_AMBULATORY_CARE_PROVIDER_SITE_OTHER): Payer: 59 | Admitting: Podiatry

## 2014-08-04 VITALS — BP 109/74 | HR 77 | Ht 67.0 in | Wt 199.0 lb

## 2014-08-04 DIAGNOSIS — M79674 Pain in right toe(s): Secondary | ICD-10-CM

## 2014-08-04 DIAGNOSIS — M79675 Pain in left toe(s): Secondary | ICD-10-CM

## 2014-08-04 DIAGNOSIS — L600 Ingrowing nail: Secondary | ICD-10-CM

## 2014-08-04 DIAGNOSIS — M79609 Pain in unspecified limb: Secondary | ICD-10-CM

## 2014-08-04 DIAGNOSIS — M79676 Pain in unspecified toe(s): Secondary | ICD-10-CM

## 2014-08-04 NOTE — Progress Notes (Signed)
Patient presents to have toe nail surgery. Both big toes and in both borders.   Procedures done: 1. Phenol and alcohol matrixectomy left hallux both borders. 2. Phenol and alcohol matrixectomy right hallux both borders.  Procedure done as follow; Both great toes were anesthetized with total 5ml mixture of 50/50 0.5% Marcaine plain and 1% Xylocaine plain. Total 10ml used. Affected nail borders were reflected with a nail elevator and excised with nail nipper. Both borders of proximal nail matrix tissue was cauterized with Phenol soaked cotton applicator x 4 and neutralized with Alcohol soaked cotton applicator. The wound was dressed with Amerigel ointment dressing. Identical procedures were done on both great toes. Home care instructions and supply dispensed.  Return in 1 week for follow up.

## 2014-08-09 ENCOUNTER — Encounter: Payer: 59 | Admitting: Podiatry

## 2014-10-02 ENCOUNTER — Encounter: Payer: Self-pay | Admitting: Podiatry

## 2017-10-27 ENCOUNTER — Encounter (HOSPITAL_COMMUNITY): Payer: Self-pay

## 2017-10-27 ENCOUNTER — Emergency Department (HOSPITAL_COMMUNITY)
Admission: EM | Admit: 2017-10-27 | Discharge: 2017-10-27 | Disposition: A | Discharge status: Home / Self Care | Payer: 59 | Attending: Emergency Medicine | Admitting: Emergency Medicine

## 2017-10-27 ENCOUNTER — Other Ambulatory Visit: Payer: Self-pay

## 2017-10-27 ENCOUNTER — Emergency Department (HOSPITAL_COMMUNITY): Payer: 59

## 2017-10-27 DIAGNOSIS — R110 Nausea: Secondary | ICD-10-CM | POA: Diagnosis not present

## 2017-10-27 DIAGNOSIS — R1031 Right lower quadrant pain: Secondary | ICD-10-CM | POA: Diagnosis present

## 2017-10-27 DIAGNOSIS — N83201 Unspecified ovarian cyst, right side: Secondary | ICD-10-CM

## 2017-10-27 LAB — CBC WITH DIFFERENTIAL/PLATELET
BASOS ABS: 0 10*3/uL (ref 0.0–0.1)
BASOS PCT: 0 %
EOS ABS: 0 10*3/uL (ref 0.0–0.7)
Eosinophils Relative: 1 %
HCT: 39.1 % (ref 36.0–46.0)
Hemoglobin: 13 g/dL (ref 12.0–15.0)
Lymphocytes Relative: 23 %
Lymphs Abs: 1.4 10*3/uL (ref 0.7–4.0)
MCH: 28.5 pg (ref 26.0–34.0)
MCHC: 33.2 g/dL (ref 30.0–36.0)
MCV: 85.7 fL (ref 78.0–100.0)
Monocytes Absolute: 0.3 10*3/uL (ref 0.1–1.0)
Monocytes Relative: 5 %
NEUTROS PCT: 71 %
Neutro Abs: 4.3 10*3/uL (ref 1.7–7.7)
Platelets: 242 10*3/uL (ref 150–400)
RBC: 4.56 MIL/uL (ref 3.87–5.11)
RDW: 13.3 % (ref 11.5–15.5)
WBC: 5.9 10*3/uL (ref 4.0–10.5)

## 2017-10-27 LAB — COMPREHENSIVE METABOLIC PANEL
ALBUMIN: 4.4 g/dL (ref 3.5–5.0)
ALT: 18 U/L (ref 14–54)
ANION GAP: 9 (ref 5–15)
AST: 19 U/L (ref 15–41)
Alkaline Phosphatase: 41 U/L (ref 38–126)
BILIRUBIN TOTAL: 0.5 mg/dL (ref 0.3–1.2)
BUN: 16 mg/dL (ref 6–20)
CHLORIDE: 102 mmol/L (ref 101–111)
CO2: 25 mmol/L (ref 22–32)
Calcium: 9.2 mg/dL (ref 8.9–10.3)
Creatinine, Ser: 0.76 mg/dL (ref 0.44–1.00)
GFR calc Af Amer: >60 mL/min (ref >60)
GFR calc non Af Amer: >60 mL/min (ref >60)
GLUCOSE: 109 mg/dL — AB (ref 65–99)
POTASSIUM: 3.8 mmol/L (ref 3.5–5.1)
Sodium: 136 mmol/L (ref 135–145)
Total Protein: 8 g/dL (ref 6.5–8.1)

## 2017-10-27 LAB — URINALYSIS, ROUTINE W REFLEX MICROSCOPIC
Bilirubin Urine: NEGATIVE
Glucose, UA: NEGATIVE mg/dL
Hgb urine dipstick: NEGATIVE
Ketones, ur: NEGATIVE mg/dL
LEUKOCYTES UA: NEGATIVE
Nitrite: NEGATIVE
Protein, ur: NEGATIVE mg/dL
SPECIFIC GRAVITY, URINE: 1.006 (ref 1.005–1.030)
pH: 7 (ref 5.0–8.0)

## 2017-10-27 LAB — LIPASE, BLOOD: Lipase: 22 U/L (ref 11–51)

## 2017-10-27 LAB — I-STAT BETA HCG BLOOD, ED (MC, WL, AP ONLY)

## 2017-10-27 MED ORDER — MORPHINE SULFATE (PF) 4 MG/ML IV SOLN
4.0000 mg | Freq: Once | INTRAVENOUS | Status: DC
  Filled 2017-10-27: qty 1

## 2017-10-27 MED ORDER — IOPAMIDOL (ISOVUE-300) INJECTION 61%
INTRAVENOUS | Status: AC
  Filled 2017-10-27: qty 100

## 2017-10-27 MED ORDER — NAPROXEN 500 MG PO TABS: 500.0000 mg | ORAL_TABLET | Freq: Two times a day (BID) | ORAL | 0 refills | Status: AC

## 2017-10-27 MED ORDER — SODIUM CHLORIDE 0.9 % IV BOLUS (SEPSIS)
1000.0000 mL | Freq: Once | INTRAVENOUS | Status: AC
  Administered 2017-10-27: 1000 mL via INTRAVENOUS

## 2017-10-27 MED ORDER — ONDANSETRON 4 MG PO TBDP: 4.0000 mg | ORAL_TABLET | Freq: Three times a day (TID) | ORAL | 0 refills | Status: AC | PRN

## 2017-10-27 MED ORDER — OXYCODONE-ACETAMINOPHEN 5-325 MG PO TABS: 1.0000 | ORAL_TABLET | Freq: Four times a day (QID) | ORAL | 0 refills | Status: AC | PRN

## 2017-10-27 MED ORDER — KETOROLAC TROMETHAMINE 30 MG/ML IJ SOLN
30.0000 mg | Freq: Once | INTRAMUSCULAR | Status: AC
Start: 2017-10-27 — End: 2017-10-27
  Administered 2017-10-27: 30 mg via INTRAVENOUS
  Filled 2017-10-27: qty 1

## 2017-10-27 MED ORDER — ONDANSETRON HCL 4 MG/2ML IJ SOLN
4.0000 mg | Freq: Once | INTRAMUSCULAR | Status: AC
  Administered 2017-10-27: 4 mg via INTRAVENOUS
  Filled 2017-10-27: qty 2

## 2017-10-27 MED ORDER — MORPHINE SULFATE (PF) 4 MG/ML IV SOLN
4.0000 mg | Freq: Once | INTRAVENOUS | Status: AC
  Administered 2017-10-27: 4 mg via INTRAVENOUS
  Filled 2017-10-27: qty 1

## 2017-10-27 MED ORDER — IOPAMIDOL (ISOVUE-300) INJECTION 61%
100.0000 mL | Freq: Once | INTRAVENOUS | Status: AC | PRN
  Administered 2017-10-27: 100 mL via INTRAVENOUS

## 2017-10-27 NOTE — ED Provider Notes (Signed)
[facility]-EMERGENCY DEPT Provider Note   CSN: [id number] Arrival date & time: 10/27/17  1143     History   Chief Complaint Chief Complaint  Patient presents with  . Abdominal Pain    HPI Megan [name] is a 41 y.o. female who presents to the ED with complaints of sudden onset RLQ pain that began at 4pm yesterday afternoon.  Pt states she went to [facility] and they did a U/A which had a slight amount of blood in it (which she thinks is because she just ended her menses on Sunday 2 days ago) so they gave her flomax as well as phenergan and vicodin, but said if she didn't improve then to go to the ER to get a CT scan to r/o appendicitis.  She describes her pain as 10/10 constant aching and throbbing nonradiating RLQ pain that worsens with walking and has been unrelieved with Flomax, Phenergan, and Vicodin.  She reports associated nausea.  She had an apple around 11 AM in order to take her medications, but has not eaten anything else.  Her PCP is [place].  She denies fevers, chills, CP, SOB, vomiting, diarrhea/constipation, obstipation, melena, hematochezia, hematuria, dysuria, vaginal bleeding/discharge, myalgias, arthralgias, numbness, tingling, focal weakness, or any other complaints at this time. Denies recent travel, sick contacts, suspicious food intake, EtOH use, NSAID use, or prior abd surgeries.    The history is provided by the patient and medical records. No language interpreter was used.  Abdominal Pain   This is a new problem. The current episode started yesterday. The problem occurs constantly. The problem has been gradually worsening. The pain is associated with an unknown factor. The pain is located in the RLQ. The quality of the pain is aching and throbbing. The pain is at a severity of 10/10. The pain is severe. Associated symptoms include nausea. Pertinent negatives include fever, diarrhea, flatus, hematochezia, melena, vomiting, constipation, dysuria,  hematuria, arthralgias and myalgias. The symptoms are aggravated by activity. Nothing relieves the symptoms.    Past Medical History:  Diagnosis Date  . Allergy   . Medical history non-contributory     Patient Active Problem List   Diagnosis Date Noted  . Ingrown nail 07/19/2014  . Pain in toe 07/19/2014  . Postpartum state 02/23/2014  . Normal labor and delivery 02/22/2014    Past Surgical History:  Procedure Laterality Date  . NO PAST SURGERIES      OB History    Gravida Para Term Preterm AB Living   2 1 1   1 1    SAB TAB Ectopic Multiple Live Births   1       1       Home Medications    Prior to Admission medications   Medication Sig Start Date End Date Taking? Authorizing Provider  Efinaconazole (JUBLIA) 10 % SOLN Apply 1 applicator topically every morning. Dispense 8ml bottle. 07/19/14   Sheard, Myeong O, DPM  ibuprofen (ADVIL,MOTRIN) 800 MG tablet Take 1 tablet (800 mg total) by mouth every 8 (eight) hours. 02/25/14   Essie Hart, MD  loratadine (CLARITIN) 10 MG tablet Take 10 mg by mouth daily.    [provider]  Prenatal Vit-Fe Fumarate-FA (PRENATAL MULTIVITAMIN) TABS tablet Take 1 tablet by mouth daily at 12 noon.    [provider]    Family History Family History  Problem Relation Age of Onset  . Hypertension Mother   . Hypertension Father     Social History Social History  Tobacco Use  . Smoking status: Never Smoker  . Smokeless tobacco: Never Used  Substance Use Topics  . Alcohol use: No  . Drug use: No     Allergies   Banana   Review of Systems Review of Systems  Constitutional: Negative for chills and fever.  Respiratory: Negative for shortness of breath.   Cardiovascular: Negative for chest pain.  Gastrointestinal: Positive for abdominal pain and nausea. Negative for blood in stool, constipation, diarrhea, flatus, hematochezia, melena and vomiting.  Genitourinary: Negative for dysuria, hematuria, vaginal bleeding  and vaginal discharge.  Musculoskeletal: Negative for arthralgias and myalgias.  Skin: Negative for color change.  Allergic/Immunologic: Negative for immunocompromised state.  Neurological: Negative for weakness and numbness.  Psychiatric/Behavioral: Negative for confusion.   All other systems reviewed and are negative for acute change except as noted in the HPI.    Physical Exam Updated Vital Signs BP 94/64   Pulse 84   Temp 98.8 F (37.1 C) (Oral)   Resp 16   Ht 5\' 8"  (1.727 m)   Wt 80.7 kg (178 lb)   LMP 10/25/2017   SpO2 100%   BMI 27.06 kg/m   Physical Exam  Constitutional: She is oriented to person, place, and time. Vital signs are normal. She appears well-developed and well-nourished.  Non-toxic appearance. She appears ill (appears to feel unwell, but not acutely ill appearing). No distress.  Afebrile, nontoxic, appears to feel ill, but in NAD; doesn't want to move much on the bed; BP soft  HENT:  Head: Normocephalic and atraumatic.  Mouth/Throat: Oropharynx is clear and moist. Mucous membranes are dry.  Dry lips  Eyes: Conjunctivae and EOM are normal. Right eye exhibits no discharge. Left eye exhibits no discharge.  Neck: Normal range of motion. Neck supple.  Cardiovascular: Normal rate, regular rhythm, normal heart sounds and intact distal pulses. Exam reveals no gallop and no friction rub.  No murmur heard. Pulmonary/Chest: Effort normal and breath sounds normal. No respiratory distress. She has no decreased breath sounds. She has no wheezes. She has no rhonchi. She has no rales.  Abdominal: Soft. Normal appearance and bowel sounds are normal. She exhibits no distension. There is tenderness in the right lower quadrant. There is guarding and tenderness at McBurney's point. There is no rigidity, no rebound, no CVA tenderness and negative Murphy's sign.  Soft, nondistended, +BS throughout, with exquisite RLQ TTP at mcburney's point, slight voluntary guarding, no rebound or  rigidity, neg murphy's, +psoas sign, neg foot tap test, no CVA TTP   Musculoskeletal: Normal range of motion.  Neurological: She is alert and oriented to person, place, and time. She has normal strength. No sensory deficit.  Skin: Skin is warm, dry and intact. No rash noted.  Psychiatric: She has a normal mood and affect.  Nursing note and vitals reviewed.    ED Treatments / Results  Labs (all labs ordered are listed, but only abnormal results are displayed) Labs Reviewed  COMPREHENSIVE METABOLIC PANEL - Abnormal; Notable for the following components:      Result Value   Glucose, Bld 109 (*)    All other components within normal limits  URINALYSIS, ROUTINE W REFLEX MICROSCOPIC - Abnormal; Notable for the following components:   Color, Urine STRAW (*)    All other components within normal limits  LIPASE, BLOOD  CBC WITH DIFFERENTIAL/PLATELET  I-STAT BETA HCG BLOOD, ED ([facility], [facility], [facility] ONLY)    EKG  EKG Interpretation None       Radiology US  Pelvis Transvanginal Non-ob (tv Only)  Result Date: 10/27/2017 CLINICAL DATA:  Initial evaluation for acute right pelvic pain. EXAM: TRANSABDOMINAL AND TRANSVAGINAL ULTRASOUND OF PELVIS DOPPLER ULTRASOUND OF OVARIES TECHNIQUE: Both transabdominal and transvaginal ultrasound examinations of the pelvis were performed. Transabdominal technique was performed for global imaging of the pelvis including uterus, ovaries, adnexal regions, and pelvic cul-de-sac. It was necessary to proceed with endovaginal exam following the transabdominal exam to visualize the uterus and ovaries. Color and duplex Doppler ultrasound was utilized to evaluate blood flow to the ovaries. COMPARISON:  Prior CT from earlier the same day. FINDINGS: Uterus Measurements: 7.9 x 3.3 x 4.9 cm. No fibroids or other mass visualized. 6 x 7 x 6 mm mildly complex nabothian cyst noted. Endometrium Thickness: 2.5 mm.  No focal abnormality visualized. Right ovary Measurements: 10.2 x 7.3 x 7.0  cm. Large unilocular simple anechoic cyst measuring 7.0 x 6.5 x 6.4 cm is present. This appears to arise from the right ovary, and most likely reflects a large ovarian cyst. No internal complexity evident by sonography. No internal vascularity. The adjacent right ovary demonstrates a fairly normal sonographic appearance. Left ovary Measurements: 4.2 x 2.5 x 2.5 cm. Normal appearance/no adnexal mass. Pulsed Doppler evaluation of both ovaries demonstrates normal low-resistance arterial and venous waveforms. Other findings Small volume free fluid within the pelvis. IMPRESSION: 1. 7.0 x 6.5 x 6.4 cm simple right ovarian cyst. Since these may be difficult to assess completely with [place], further evaluation of simple-appearing cysts >7 cm with MRI or surgical evaluation is recommended according to the Society of Radiologists in Ultrasound 2010 Consensus Conference Statement (D Lenis Noon et al. Management of Asymptomatic Ovarian and other Adnexal Cysts Imaged at [place]: Society of Radiologists in Ultrasound Consensus Conference Statement 2010. Radiology 256 (Sept 2010): 943-954.). 2. Associated small volume free fluid within the pelvis, likely physiologic. 3. No other acute abnormality within the pelvis. No evidence for torsion. Electronically Signed   By: Rise Mu M.D.   On: 10/27/2017 18:08   US Pelvis (transabdominal Only)  Result Date: 10/27/2017 CLINICAL DATA:  Initial evaluation for acute right pelvic pain. EXAM: TRANSABDOMINAL AND TRANSVAGINAL ULTRASOUND OF PELVIS DOPPLER ULTRASOUND OF OVARIES TECHNIQUE: Both transabdominal and transvaginal ultrasound examinations of the pelvis were performed. Transabdominal technique was performed for global imaging of the pelvis including uterus, ovaries, adnexal regions, and pelvic cul-de-sac. It was necessary to proceed with endovaginal exam following the transabdominal exam to visualize the uterus and ovaries. Color and duplex Doppler ultrasound was utilized to evaluate  blood flow to the ovaries. COMPARISON:  Prior CT from earlier the same day. FINDINGS: Uterus Measurements: 7.9 x 3.3 x 4.9 cm. No fibroids or other mass visualized. 6 x 7 x 6 mm mildly complex nabothian cyst noted. Endometrium Thickness: 2.5 mm.  No focal abnormality visualized. Right ovary Measurements: 10.2 x 7.3 x 7.0 cm. Large unilocular simple anechoic cyst measuring 7.0 x 6.5 x 6.4 cm is present. This appears to arise from the right ovary, and most likely reflects a large ovarian cyst. No internal complexity evident by sonography. No internal vascularity. The adjacent right ovary demonstrates a fairly normal sonographic appearance. Left ovary Measurements: 4.2 x 2.5 x 2.5 cm. Normal appearance/no adnexal mass. Pulsed Doppler evaluation of both ovaries demonstrates normal low-resistance arterial and venous waveforms. Other findings Small volume free fluid within the pelvis. IMPRESSION: 1. 7.0 x 6.5 x 6.4 cm simple right ovarian cyst. Since these may be difficult to assess completely with [place], further evaluation of  simple-appearing cysts >7 cm with MRI or surgical evaluation is recommended according to the Society of Radiologists in Ultrasound 2010 Consensus Conference Statement (D Lenis NoonLevine et al. Management of Asymptomatic Ovarian and other Adnexal Cysts Imaged at [place]: Society of Radiologists in Ultrasound Consensus Conference Statement 2010. Radiology 256 (Sept 2010): 943-954.). 2. Associated small volume free fluid within the pelvis, likely physiologic. 3. No other acute abnormality within the pelvis. No evidence for torsion. Electronically Signed   By: Rise MuBenjamin  McClintock M.D.   On: 10/27/2017 18:08   Ct Abdomen Pelvis W Contrast  Result Date: 10/27/2017 CLINICAL DATA:  Acute right lower quadrant pain since yesterday. EXAM: CT ABDOMEN AND PELVIS WITH CONTRAST TECHNIQUE: Multidetector CT imaging of the abdomen and pelvis was performed using the standard protocol following bolus administration of intravenous  contrast. CONTRAST:  100 cc Isovue-300 intravenous contrast. COMPARISON:  Abdominal x-rays dated Apr 18, 2013. FINDINGS: Lower chest: No acute abnormality. Hepatobiliary: No focal liver abnormality is seen. No gallstones, gallbladder wall thickening, or biliary dilatation. Pancreas: Unremarkable. No pancreatic ductal dilatation or surrounding inflammatory changes. Spleen: Normal in size without focal abnormality. Adrenals/Urinary Tract: Adrenal glands are unremarkable. Kidneys are normal, without renal calculi, focal lesion, or hydronephrosis. Bladder is unremarkable. Stomach/Bowel: Stomach is within normal limits. Appendix appears normal. No evidence of bowel wall thickening, distention, or inflammatory changes. Vascular/Lymphatic: No significant vascular findings are present. No enlarged abdominal or pelvic lymph nodes. Reproductive: In the right adnexa, there is a 7.6 x 6.4 x 7.9 cm (AP by transverse by CC) low-density lesion containing a few very fine, thin internal septations. The uterus and left ovary are unremarkable. Other: No abdominal wall hernia or abnormality. No abdominopelvic ascites. Musculoskeletal: No acute or significant osseous findings. IMPRESSION: 1. In the right adnexa, there is a large 7.6 x 6.4 x 7.9 cm low-density lesion containing a few very fine, thin internal septations, favored to represent hydrosalpinx, or possibly a large ovarian cyst. Further evaluation with pelvic ultrasound is recommended. 2. Normal appendix. Electronically Signed   By: Obie DredgeWilliam T Derry M.D.   On: 10/27/2017 15:51   [place] Pelvic Doppler (torsion R/o Or Mass Arterial Flow)  Result Date: 10/27/2017 CLINICAL DATA:  Initial evaluation for acute right pelvic pain. EXAM: TRANSABDOMINAL AND TRANSVAGINAL ULTRASOUND OF PELVIS DOPPLER ULTRASOUND OF OVARIES TECHNIQUE: Both transabdominal and transvaginal ultrasound examinations of the pelvis were performed. Transabdominal technique was performed for global imaging of the  pelvis including uterus, ovaries, adnexal regions, and pelvic cul-de-sac. It was necessary to proceed with endovaginal exam following the transabdominal exam to visualize the uterus and ovaries. Color and duplex Doppler ultrasound was utilized to evaluate blood flow to the ovaries. COMPARISON:  Prior CT from earlier the same day. FINDINGS: Uterus Measurements: 7.9 x 3.3 x 4.9 cm. No fibroids or other mass visualized. 6 x 7 x 6 mm mildly complex nabothian cyst noted. Endometrium Thickness: 2.5 mm.  No focal abnormality visualized. Right ovary Measurements: 10.2 x 7.3 x 7.0 cm. Large unilocular simple anechoic cyst measuring 7.0 x 6.5 x 6.4 cm is present. This appears to arise from the right ovary, and most likely reflects a large ovarian cyst. No internal complexity evident by sonography. No internal vascularity. The adjacent right ovary demonstrates a fairly normal sonographic appearance. Left ovary Measurements: 4.2 x 2.5 x 2.5 cm. Normal appearance/no adnexal mass. Pulsed Doppler evaluation of both ovaries demonstrates normal low-resistance arterial and venous waveforms. Other findings Small volume free fluid within the pelvis. IMPRESSION: 1. 7.0 x 6.5 x 6.4  cm simple right ovarian cyst. Since these may be difficult to assess completely with US, further evaluation of simple-appearing cysts >7 cm with MRI or surgical evaluation is recommended according to the Society of Radiologists in Ultrasound 2010 Consensus Conference Statement (D Lenis NoonLevine et al. Management of Asymptomatic Ovarian and other Adnexal Cysts Imaged at [place]: Society of Radiologists in Ultrasound Consensus Conference Statement 2010. Radiology 256 (Sept 2010): 943-954.). 2. Associated small volume free fluid within the pelvis, likely physiologic. 3. No other acute abnormality within the pelvis. No evidence for torsion. Electronically Signed   By: Rise MuBenjamin  McClintock M.D.   On: 10/27/2017 18:08    Procedures Procedures (including critical care  time)  Medications Ordered in ED Medications  iopamidol (ISOVUE-300) 61 % injection (not administered)  sodium chloride 0.9 % bolus 1,000 mL (0 mLs Intravenous Stopped 10/27/17 1747)  morphine 4 MG/ML injection 4 mg (4 mg Intravenous Given 10/27/17 1354)  ondansetron (ZOFRAN) injection 4 mg (4 mg Intravenous Given 10/27/17 1354)  iopamidol (ISOVUE-300) 61 % injection 100 mL (100 mLs Intravenous Contrast Given 10/27/17 1516)  ketorolac (TORADOL) 30 MG/ML injection 30 mg (30 mg Intravenous Given 10/27/17 1905)     Initial Impression / Assessment and Plan / ED Course  I have reviewed the triage vital signs and the nursing notes.  Pertinent labs & imaging results that were available during my care of the patient were reviewed by me and considered in my medical decision making (see chart for details).     41 y.o. female here with sudden onset RLQ pain and nausea, [facility] and had some hematuria (just ended her menses so that's likely where it was from) so they rx'd flomax as well as vicodin and phenergan; states these haven't helped so she came here at their recommendation. On exam, appears to feel ill, not wanting to move around bed much, with exquisite RLQ TTP at McBurney's point, slight guarding, no rebound or rigidity. High suspicion for appendicitis. Will get labs and CT abd/pelv; if no etiology found, may need to consider pelvic organ work up, but will hold off for now. Will give fluids and pain/nausea meds, then reassess shortly.   4:02 PM CBC w/diff WNL. CMP WNL. Lipase WNL. U/A WNL. BetaHCG neg. CT A/P with large low-density lesion containing few fine thin internal septations in R adnexa, could be hydrosalpinx or large ovarian cyst, recommend further eval with pelvic U/S. Will proceed with U/S to further define this. Pt comfortable, states pain and nausea improved at this time. Will continue to monitor and reassess after U/S results.   7:07 PM Pelvic U/S confirms  7x6.5x6.4cm right ovarian cyst, no torsion or other concerning findings. Due to the size of the cyst, I consulted OBGYN faculty practice, Dr. Adrian BlackwaterStinson advised that no emergent management is required unless her symptoms worsen and there was concern for torsion, however with reassuring U/S today then she could be discharged with NSAID of choice and have routine f/up with OBGYN. Advised warm compresses/heat therapy, will rx naprosyn and percocet for pain, will rx zofran, discussed use of this and/or phenergan for nausea. Stay hydrated. F/up with OBGYN in 1wk for recheck and ongoing management. Strict return precautions advised, discussed s/sx of torsion and [facility] changes/worsening symptoms. I explained the diagnosis and have given explicit precautions to return to the ER including for any other new or worsening symptoms. The patient understands and accepts the medical plan as it's been dictated and  I have answered their questions. Discharge instructions concerning home care and prescriptions have been given. The patient is STABLE and is discharged to home in good condition.   [place] reviewed prior to dispensing controlled substance medications, and 2 year search was notable for: none found. Risks/benefits/alternatives and expectations discussed regarding controlled substances. Side effects of medications discussed. Informed consent obtained.    Final Clinical Impressions(s) / ED Diagnoses   Final diagnoses:  Acute right lower quadrant pain  Nausea  Right ovarian cyst    ED Discharge Orders        Ordered    ondansetron (ZOFRAN ODT) 4 MG disintegrating tablet  Every 8 hours PRN     10/27/17 1848    naproxen (NAPROSYN) 500 MG tablet  2 times daily with meals     10/27/17 1848    oxyCODONE-acetaminophen (PERCOCET) 5-325 MG tablet  Every 6 hours PRN     10/27/17 [address] [place], [place]-C 10/27/17 1908    Lavera Guise,  MD 10/30/17 1250

## 2017-10-27 NOTE — ED Triage Notes (Signed)
Patient c/o RLQ pain that started at 1600 yesterday. Patient went to an UC and was prescribed Phenergan, hydrocodone, and and Flomax and ws told if not any better to come to the ED. Patient presents with worsening pain today than yesterday. Patient also c/o nausea, but no vomiting or diarrhea.

## 2017-10-27 NOTE — ED Notes (Signed)
Pt is aware a urine sample is needed but is unable to provide one at this time. 

## 2017-10-27 NOTE — Discharge Instructions (Signed)
Your symptoms are due to a large ovarian cyst. Use a heating pad to your lower abdomen to help with pain. Alternate between naprosyn and percocet as directed, as needed for pain but do not drive or operate machinery with narcotic pain medication use. You may need an over-the-counter stool softener such as colace or miralax to use with this pain medication use, if any constipation occurs. Use zofran or home phenergan as directed as needed for nausea. Stay well hydrated. Follow up with your OBGYN in 1 week for recheck of symptoms and ongoing management of your condition. [facility] to the [facility] (similar to their ER) for emergent changes or worsening symptoms, including but not limited to: fevers, sudden onset severe worsening in pain, or vomiting.

## 2017-10-27 NOTE — ED Notes (Signed)
Ultrasound is with Pt, will obtain vital signs after testing is complete.

## 2018-01-07 IMAGING — CT CT ABD-PELV W/ CM
2 of 4 series · 17 of 46 positions shown, 19 images · IV contrast (ISOVUE)
Comparison: Abdominal x-rays dated April 18, 2013.

CLINICAL DATA: Acute right lower quadrant pain since yesterday.

EXAM:
CT ABDOMEN AND PELVIS WITH CONTRAST
TECHNIQUE: Multidetector CT imaging of the abdomen and pelvis was performed
using the standard protocol following bolus administration of
intravenous contrast.
CONTRAST:  100 cc Zsovue-0BB intravenous contrast.

[Series 2: abd/pel with · axial · 0.64mm/px · z∈[-482,-82]mm · 14 of 91 slices shown, 16 images]
[im 6/91  soft-tissue]
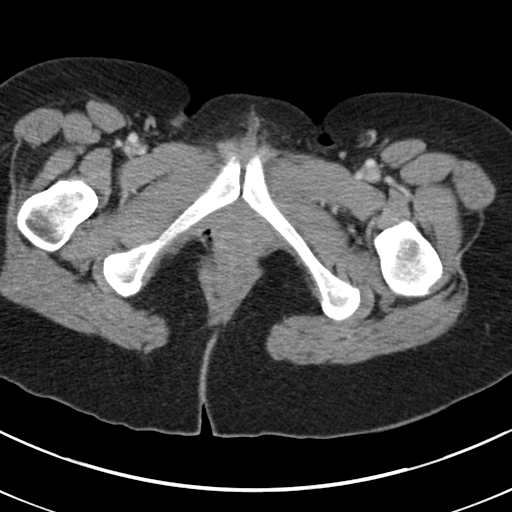
[im 6/91  bone]
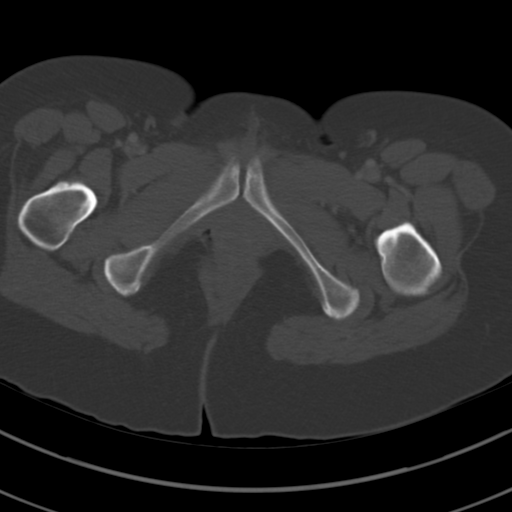
[im 11/91  soft-tissue]
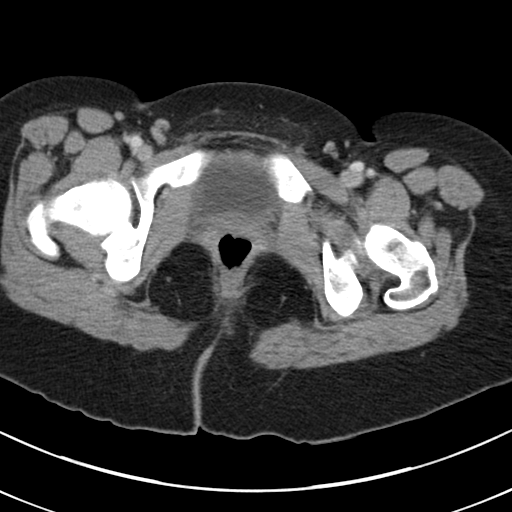
[im 21/91  soft-tissue]
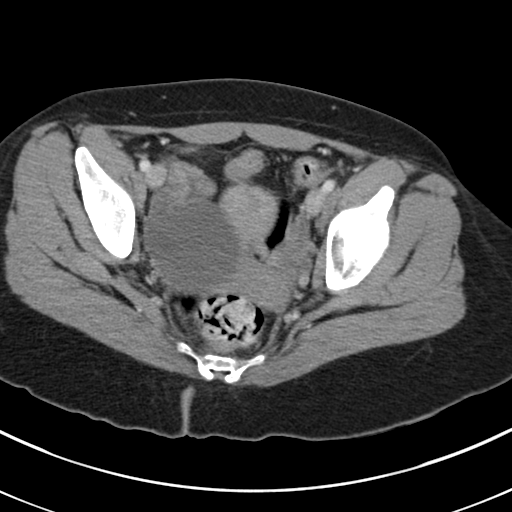
[im 26/91  soft-tissue]
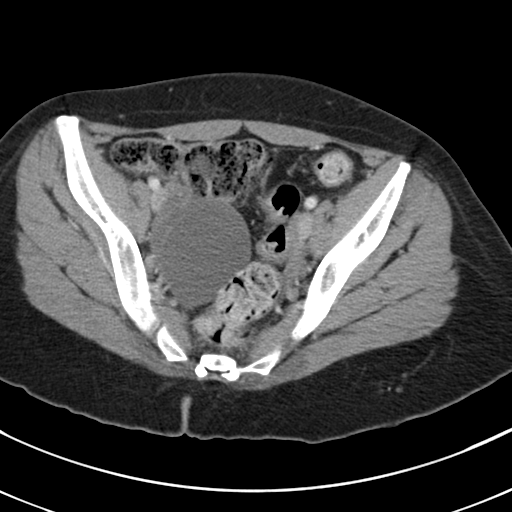
[im 31/91  soft-tissue]
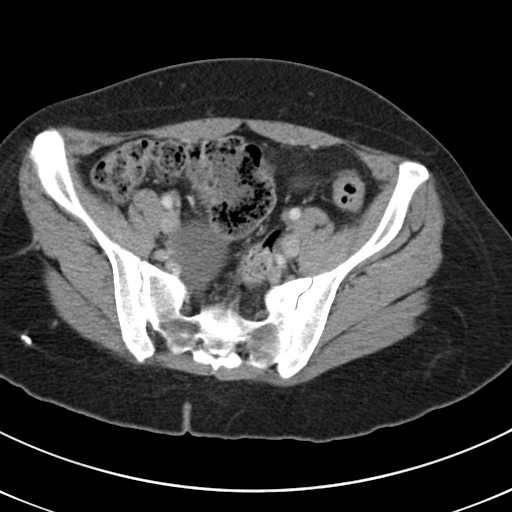
[im 36/91  soft-tissue]
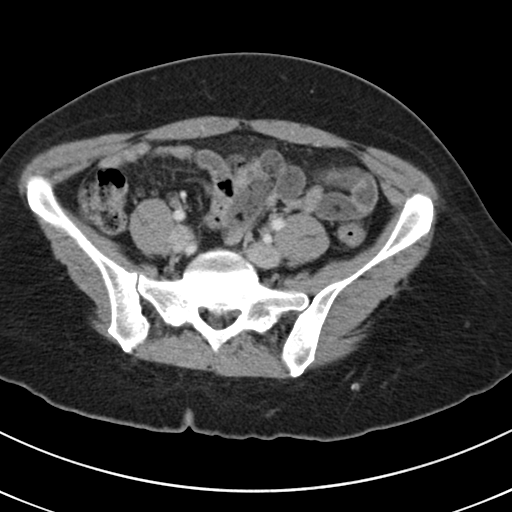
[im 41/91  soft-tissue]
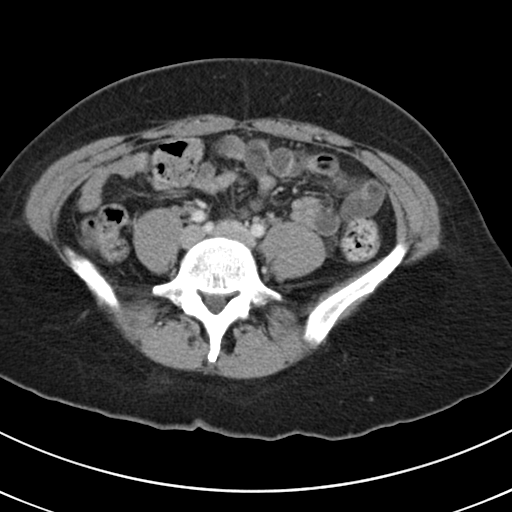
[im 51/91  soft-tissue]
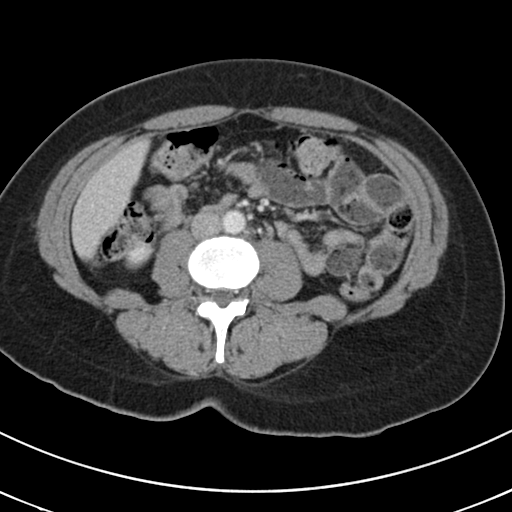
[im 56/91  soft-tissue]
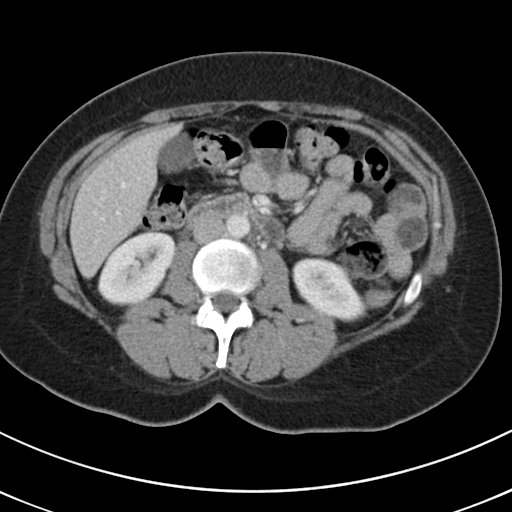
[im 56/91  bone]
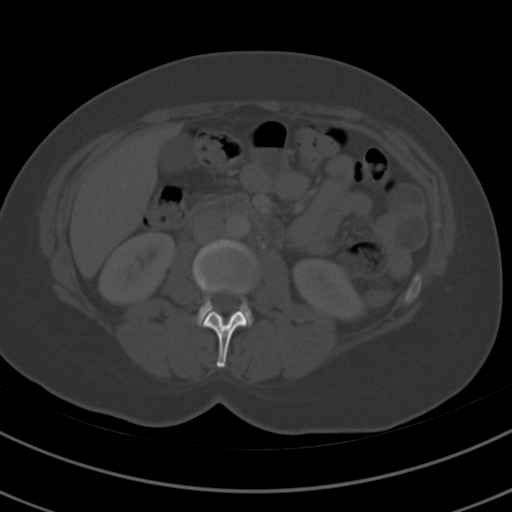
[im 61/91  soft-tissue]
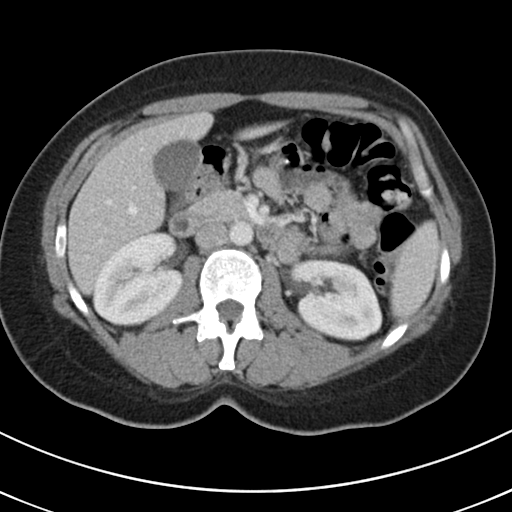
[im 66/91  soft-tissue]
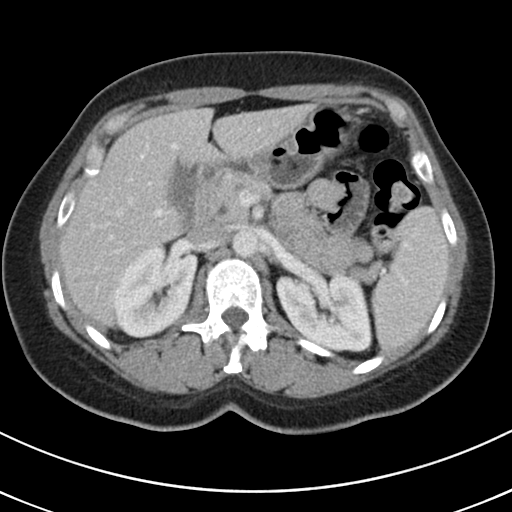
[im 71/91  soft-tissue]
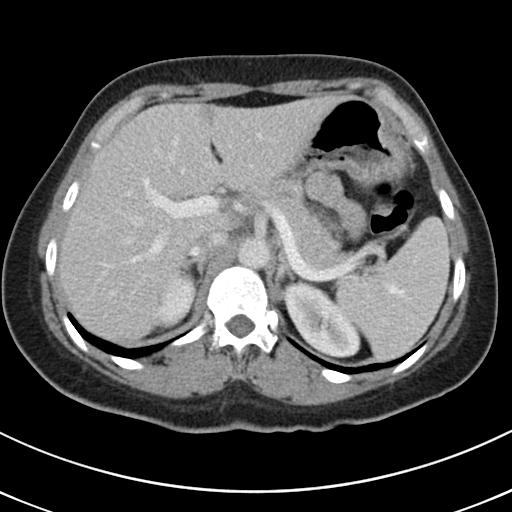
[im 81/91  soft-tissue]
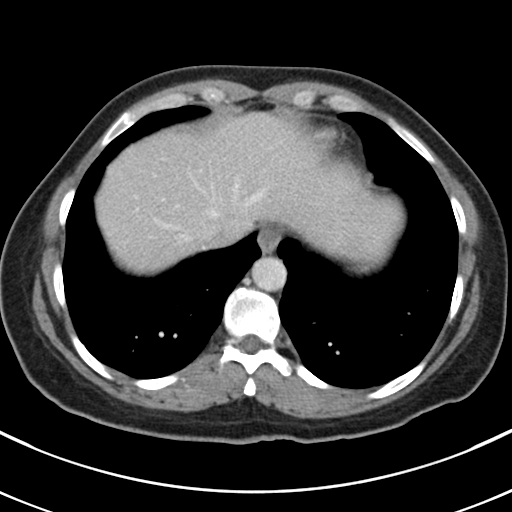
[im 86/91  soft-tissue]
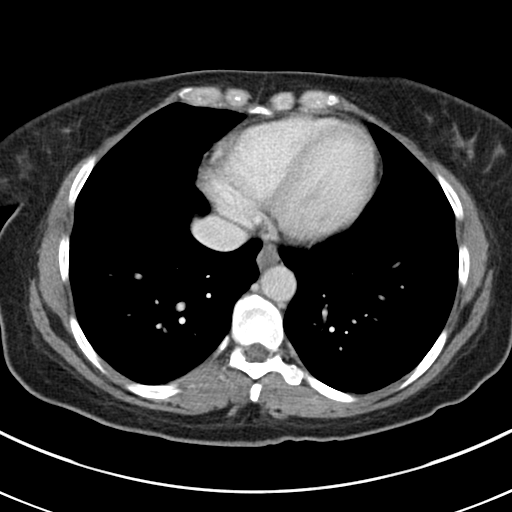

[Series 602: <mpr thick range> · coronal · 0.89mm/px · 3 of 132 slices shown]
[im 44/132  soft-tissue]
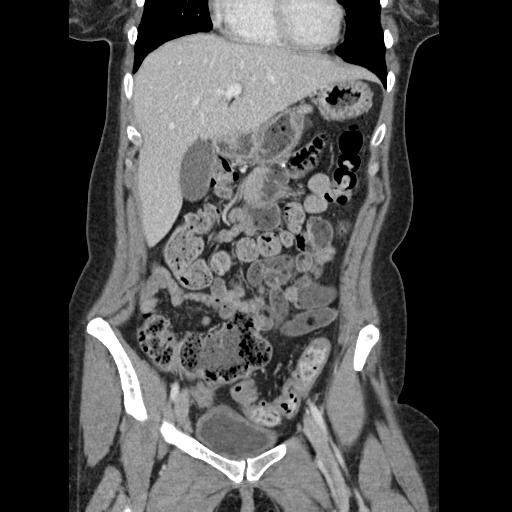
[im 59/132  soft-tissue]
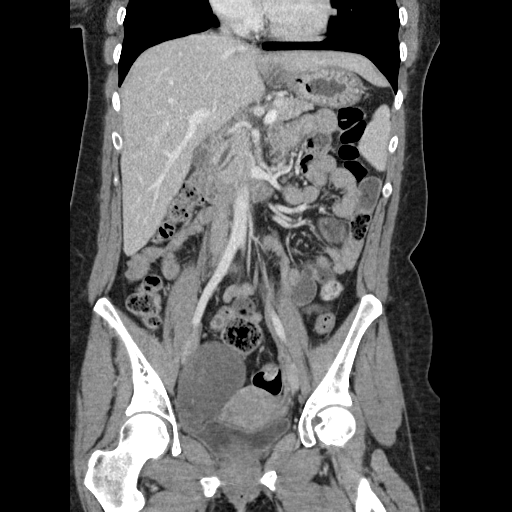
[im 73/132  soft-tissue]
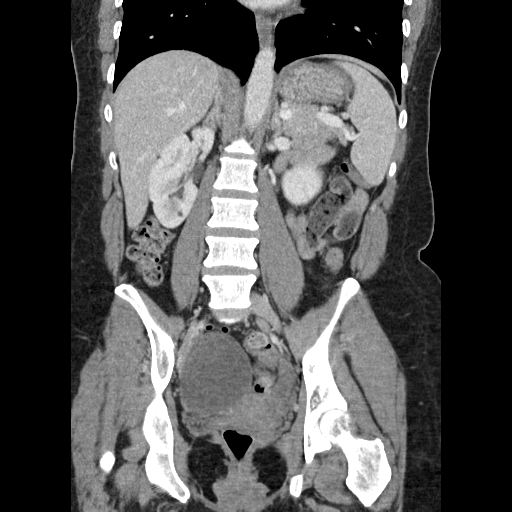

[17 of 46 positions shown; findings below may reference images not displayed]

FINDINGS: Lower chest: No acute abnormality.

Hepatobiliary: No focal liver abnormality is seen. No gallstones,
gallbladder wall thickening, or biliary dilatation.

Pancreas: Unremarkable. No pancreatic ductal dilatation or
surrounding inflammatory changes.

Spleen: Normal in size without focal abnormality.

Adrenals/Urinary Tract: Adrenal glands are unremarkable. Kidneys are
normal, without renal calculi, focal lesion, or hydronephrosis.
Bladder is unremarkable.

Stomach/Bowel: Stomach is within normal limits. Appendix appears
normal. No evidence of bowel wall thickening, distention, or
inflammatory changes.

Vascular/Lymphatic: No significant vascular findings are present. No
enlarged abdominal or pelvic lymph nodes.

Reproductive: In the right adnexa, there is a 7.6 x 6.4 x 7.9 cm (AP
by transverse by CC) low-density lesion containing a few very fine,
thin internal septations. The uterus and left ovary are
unremarkable.

Other: No abdominal wall hernia or abnormality. No abdominopelvic
ascites.

Musculoskeletal: No acute or significant osseous findings.
IMPRESSION: 1. In the right adnexa, there is a large 7.6 x 6.4 x 7.9 cm
low-density lesion containing a few very fine, thin internal
septations, favored to represent hydrosalpinx, or possibly a large
ovarian cyst. Further evaluation with pelvic ultrasound is
recommended.
2. Normal appendix.
